# Patient Record
Sex: Male | Born: 1975 | Race: Black or African American | Hispanic: No | Marital: Single | Smoking: Current some day smoker
Health system: Southern US, Community
[De-identification: ages and names within clinical notes are randomized; demographics above are authoritative.]

## PROBLEM LIST (undated history)

## (undated) ENCOUNTER — Ambulatory Visit (HOSPITAL_COMMUNITY): Admission: EM | Discharge status: Absent without leave | Payer: Self-pay

---

## 2009-12-09 ENCOUNTER — Emergency Department (HOSPITAL_COMMUNITY): Admission: EM | Admit: 2009-12-09 | Discharge: 2009-12-09 | Payer: Self-pay | Admitting: Family Medicine

## 2010-07-07 ENCOUNTER — Emergency Department (HOSPITAL_COMMUNITY): Payer: Self-pay

## 2010-07-07 ENCOUNTER — Emergency Department (HOSPITAL_COMMUNITY)
Admission: EM | Admit: 2010-07-07 | Discharge: 2010-07-07 | Disposition: A | Discharge status: Home / Self Care | Payer: Self-pay | Attending: Emergency Medicine | Admitting: Emergency Medicine

## 2010-07-07 DIAGNOSIS — S93609A Unspecified sprain of unspecified foot, initial encounter: Secondary | ICD-10-CM | POA: Insufficient documentation

## 2010-07-07 DIAGNOSIS — Y92410 Unspecified street and highway as the place of occurrence of the external cause: Secondary | ICD-10-CM | POA: Insufficient documentation

## 2010-07-07 DIAGNOSIS — IMO0002 Reserved for concepts with insufficient information to code with codable children: Secondary | ICD-10-CM | POA: Insufficient documentation

## 2010-07-26 ENCOUNTER — Emergency Department (HOSPITAL_COMMUNITY)
Admission: EM | Admit: 2010-07-26 | Discharge: 2010-07-26 | Disposition: A | Discharge status: Home / Self Care | Payer: Self-pay | Attending: Emergency Medicine | Admitting: Emergency Medicine

## 2010-07-26 DIAGNOSIS — L259 Unspecified contact dermatitis, unspecified cause: Secondary | ICD-10-CM | POA: Insufficient documentation

## 2011-08-06 ENCOUNTER — Encounter (HOSPITAL_COMMUNITY): Payer: Self-pay

## 2011-08-06 ENCOUNTER — Emergency Department (HOSPITAL_COMMUNITY)
Admission: EM | Admit: 2011-08-06 | Discharge: 2011-08-06 | Disposition: A | Discharge status: Home / Self Care | Payer: Self-pay | Attending: Emergency Medicine | Admitting: Emergency Medicine

## 2011-08-06 DIAGNOSIS — F17200 Nicotine dependence, unspecified, uncomplicated: Secondary | ICD-10-CM | POA: Insufficient documentation

## 2011-08-06 DIAGNOSIS — M545 Low back pain, unspecified: Secondary | ICD-10-CM | POA: Insufficient documentation

## 2011-08-06 DIAGNOSIS — W19XXXA Unspecified fall, initial encounter: Secondary | ICD-10-CM | POA: Insufficient documentation

## 2011-08-06 DIAGNOSIS — Z79899 Other long term (current) drug therapy: Secondary | ICD-10-CM | POA: Insufficient documentation

## 2011-08-06 DIAGNOSIS — T148XXA Other injury of unspecified body region, initial encounter: Secondary | ICD-10-CM | POA: Insufficient documentation

## 2011-08-06 DIAGNOSIS — M542 Cervicalgia: Secondary | ICD-10-CM | POA: Insufficient documentation

## 2011-08-06 MED ORDER — NAPROXEN 500 MG PO TABS: 500.0000 mg | ORAL_TABLET | Freq: Two times a day (BID) | ORAL | Status: DC | PRN

## 2011-08-06 MED ORDER — DIAZEPAM 5 MG PO TABS: 5.0000 mg | ORAL_TABLET | Freq: Three times a day (TID) | ORAL | Status: AC | PRN

## 2011-08-06 MED ORDER — IBUPROFEN 200 MG PO TABS
400.0000 mg | ORAL_TABLET | Freq: Once | ORAL | Status: AC
  Administered 2011-08-06: 400 mg via ORAL
  Filled 2011-08-06: qty 2

## 2011-08-06 MED ORDER — OXYCODONE-ACETAMINOPHEN 5-325 MG PO TABS: 1.0000 | ORAL_TABLET | ORAL | Status: AC | PRN

## 2011-08-06 NOTE — ED Provider Notes (Signed)
History    Or back pain and neck pain. Acute onset earlier today. Patient was helping to dislodge a tractor which was lodged in the mud. It gave free and he lost his balance and went into a fence. Acute onset lower back pain. Since then had also developed left-sided neck pain. Has been ambulatory since. No numbness, tingling or loss of strength. Denies history of chronic back or neck pain. No blood thinning medication. Denies hx of back or neck surgery.  CSN: [id number]  Arrival date & time 08/06/11  1322   First MD Initiated Contact with Patient 08/06/11 1506      Chief Complaint  Patient presents with  . Back Pain  . Neck Pain    (Consider location/radiation/quality/duration/timing/severity/associated sxs/prior treatment) HPI  History reviewed. No pertinent past medical history.  History reviewed. No pertinent past surgical history.  History reviewed. No pertinent family history.  History  Substance Use Topics  . Smoking status: Current Some Day Smoker  . Smokeless tobacco: Not on file  . Alcohol Use: Yes     occasionally      Review of Systems   Review of symptoms negative unless otherwise noted in HPI.   Allergies  Review of patient's allergies indicates no known allergies.  Home Medications   Current Outpatient Rx  Name Route Sig Dispense Refill  . DIAZEPAM 5 MG PO TABS Oral Take 1 tablet (5 mg total) by mouth every 8 (eight) hours as needed (muscle spasm). 9 tablet 0  . NAPROXEN 500 MG PO TABS Oral Take 1 tablet (500 mg total) by mouth 2 (two) times daily as needed. 30 tablet 0  . OXYCODONE-ACETAMINOPHEN 5-325 MG PO TABS Oral Take 1 tablet by mouth every 4 (four) hours as needed for pain. 8 tablet 0    BP 133/86  Pulse 80  Temp(Src) 98.6 F (37 C) (Oral)  Resp 16  SpO2 96%  Physical Exam  Nursing note and vitals reviewed. Constitutional: He is oriented to person, place, and time. He appears well-developed and well-nourished. No distress.  HENT:    Head: Normocephalic and atraumatic.  Eyes: Conjunctivae are normal. Right eye exhibits no discharge. Left eye exhibits no discharge.  Neck: Neck supple.  Cardiovascular: Normal rate, regular rhythm and normal heart sounds.  Exam reveals no gallop and no friction rub.   No murmur heard. Pulmonary/Chest: Effort normal and breath sounds normal. No respiratory distress.  Abdominal: Soft. He exhibits no distension. There is no tenderness.  Musculoskeletal: He exhibits no edema and no tenderness.       No midline spinal tenderness. Mild tenderness in the left paraspinal lumbar region. Overlying skin is grossly normal in appearance.   Neurological: He is alert and oriented to person, place, and time. No cranial nerve deficit. He exhibits normal muscle tone. Coordination normal.       Strength is 5 out of 5 bilateral lower extremities. Sensation is grossly intact to light touch. Patellar reflexes are 2+ bilaterally.Gait is steady.  Skin: Skin is warm and dry.  Psychiatric: He has a normal mood and affect. His behavior is normal. Thought content normal.    ED Course  Procedures (including critical care time)  Labs Reviewed - No data to display No results found.   1. Pain in lower back   2. Muscle strain       MDM  36 year old male with lower back and neck pain. Suspect muscular strain. No signs of spinal cord impingement. Very low suspicion for fracture. Imaging  was deferred at this time. Plan symptomatic treatment. Return precautions were discussed. Outpatient followup otherwise.        Raeford Razor, MD 08/06/11 [phone]

## 2011-08-06 NOTE — ED Notes (Signed)
Patient reports that his lawn mower was stuck in the mud and when trying to get mower out he was pinned between mower and the fence. Patient is now c/o low back pain and posterior neck pain.

## 2011-08-06 NOTE — Discharge Instructions (Signed)
Back Pain, Adult Low back pain is very common. About 1 in 5 people have back pain.The cause of low back pain is rarely dangerous. The pain often gets better over time.About half of people with a sudden onset of back pain feel better in just 2 weeks. About 8 in 10 people feel better by 6 weeks.  CAUSES Some common causes of back pain include:  Strain of the muscles or ligaments supporting the spine.   Wear and tear (degeneration) of the spinal discs.   Arthritis.   Direct injury to the back.  DIAGNOSIS Most of the time, the direct cause of low back pain is not known.However, back pain can be treated effectively even when the exact cause of the pain is unknown.Answering your caregiver's questions about your overall health and symptoms is one of the most accurate ways to make sure the cause of your pain is not dangerous. If your caregiver needs more information, he or she may order lab work or imaging tests (X-rays or MRIs).However, even if imaging tests show changes in your back, this usually does not require surgery. HOME CARE INSTRUCTIONS For many people, back pain returns.Since low back pain is rarely dangerous, it is often a condition that people can learn to [facility] their own.   Remain active. It is stressful on the back to sit or stand in one place. Do not sit, drive, or stand in one place for more than 30 minutes at a time. Take short walks on level surfaces as soon as pain allows.Try to increase the length of time you walk each day.   Do not stay in bed.Resting more than 1 or 2 days can delay your recovery.   Do not avoid exercise or work.Your body is made to move.It is not dangerous to be active, even though your back may hurt.Your back will likely heal faster if you return to being active before your pain is gone.   Pay attention to your body when you bend and lift. Many people have less discomfortwhen lifting if they bend their knees, keep the load close to their  bodies,and avoid twisting. Often, the most comfortable positions are those that put less stress on your recovering back.   Find a comfortable position to sleep. Use a firm mattress and lie on your side with your knees slightly bent. If you lie on your back, put a pillow under your knees.   Only take over-the-counter or prescription medicines as directed by your caregiver. Over-the-counter medicines to reduce pain and inflammation are often the most helpful.Your caregiver may prescribe muscle relaxant drugs.These medicines help dull your pain so you can more quickly return to your normal activities and healthy exercise.   Put ice on the injured area.   Put ice in a plastic bag.   Place a towel between your skin and the bag.   Leave the ice on for 15 to 20 minutes, 3 to 4 times a day for the first 2 to 3 days. After that, ice and heat may be alternated to reduce pain and spasms.   Ask your caregiver about trying back exercises and gentle massage. This may be of some benefit.   Avoid feeling anxious or stressed.Stress increases muscle tension and can worsen back pain.It is important to recognize when you are anxious or stressed and learn ways to manage it.Exercise is a great option.  SEEK MEDICAL CARE IF:  You have pain that is not relieved with rest or medicine.   You have  pain that does not improve in 1 week.   You have new symptoms.   You are generally not feeling well.  SEEK IMMEDIATE MEDICAL CARE IF:   You have pain that radiates from your back into your legs.   You develop new bowel or bladder control problems.   You have unusual weakness or numbness in your arms or legs.   You develop nausea or vomiting.   You develop abdominal pain.   You feel faint.  Document Released: 03/22/2005 Document Revised: 03/11/2011 Document Reviewed: 08/10/2010 [facility] Patient Information 2012 [employer], [place].Muscle Strain A muscle strain (pulled muscle) happens when a muscle is  over-stretched. Recovery usually takes 5 to 6 weeks.  HOME CARE   Put ice on the injured area.   Put ice in a plastic bag.   Place a towel between your skin and the bag.   Leave the ice on for 15 to 20 minutes at a time, every hour for the first 2 days.   Do not use the muscle for several days or until your doctor says you can. Do not use the muscle if you have pain.   Wrap the injured area with an elastic bandage for comfort. Do not put it on too tightly.   Only take medicine as told by your doctor.   Warm up before exercise. This helps prevent muscle strains.  GET HELP RIGHT AWAY IF:  There is increased pain or puffiness (swelling) in the affected area. MAKE SURE YOU:   Understand these instructions.   Will watch your condition.   Will get help right away if you are not doing well or get worse.  Document Released: 12/30/2007 Document Revised: 03/11/2011 Document Reviewed: 12/30/2007 [facility] Patient Information 2012 [place], [place].  RESOURCE GUIDE  Dental Problems  Patients with Medicaid: [facility] [phone]                                           [phone] [address] Phone:  [phone]                                                  Phone:  [phone]  If unable to pay or uninsured, contact:  [facility] or [facility]. to become qualified for the adult dental clinic.  Chronic Pain Problems Contact [facility]  [phone] Patients need to be referred by their primary care doctor.  Insufficient Money for Medicine Contact [employer]:  call "211" or [facility] 562-435-0469.  No Primary Care Doctor Call [facility]  [phone] Other agencies that provide inexpensive medical care    [facility]  [phone]    [facility]  [phone]    [facility]  [phone]    [facility]  [phone]    [facility]  747-237-7983     [facility]  [phone]  Psychological Services [facility]  [phone] [facility]  [phone] [facility]   [phone] (emergency services [phone])  Substance Abuse Resources [facility]  984-828-4725  [facility] [phone] [facility] 8194676075 Floydene Flock [phone] Residential & Outpatient Substance Abuse Program  713-687-5813  Abuse/Neglect [facility] Child Abuse Hotline 502-712-5083 [facility] Child Abuse Hotline (605) 198-0646 (After Hours)  Emergency Shelter [facility] 352-849-9090  Maternity Homes Room at the [place] [facility] [place] [phone] [place] Alert Services 8652283462  MRSA Hotline #:   (716) 473-2590    [facility] Resources  [facility]     [employer]                          [facility] Dept. [address]                        [address]      [address]  Blondell Reveal Phone:  [phone]                                   Phone:  [phone]                 Phone:  [phone]  [facility] Phone:  [phone]  [facility] Child Abuse Hotline 3010405945 [phone] (After Hours)

## 2012-01-03 ENCOUNTER — Emergency Department (HOSPITAL_COMMUNITY)
Admission: EM | Admit: 2012-01-03 | Discharge: 2012-01-03 | Disposition: A | Discharge status: Home / Self Care | Payer: Self-pay | Attending: Emergency Medicine | Admitting: Emergency Medicine

## 2012-01-03 ENCOUNTER — Encounter (HOSPITAL_COMMUNITY): Payer: Self-pay | Admitting: Emergency Medicine

## 2012-01-03 DIAGNOSIS — Z1889 Other specified retained foreign body fragments: Secondary | ICD-10-CM | POA: Insufficient documentation

## 2012-01-03 DIAGNOSIS — F17200 Nicotine dependence, unspecified, uncomplicated: Secondary | ICD-10-CM | POA: Insufficient documentation

## 2012-01-03 DIAGNOSIS — S60559A Superficial foreign body of unspecified hand, initial encounter: Secondary | ICD-10-CM

## 2012-01-03 DIAGNOSIS — X58XXXA Exposure to other specified factors, initial encounter: Secondary | ICD-10-CM | POA: Insufficient documentation

## 2012-01-03 DIAGNOSIS — Z23 Encounter for immunization: Secondary | ICD-10-CM | POA: Insufficient documentation

## 2012-01-03 DIAGNOSIS — S61409A Unspecified open wound of unspecified hand, initial encounter: Secondary | ICD-10-CM | POA: Insufficient documentation

## 2012-01-03 MED ORDER — CEPHALEXIN 500 MG PO CAPS: 500.0000 mg | ORAL_CAPSULE | Freq: Two times a day (BID) | ORAL | Status: DC

## 2012-01-03 MED ORDER — TETANUS-DIPHTH-ACELL PERTUSSIS 5-2.5-18.5 LF-MCG/0.5 IM SUSP
0.5000 mL | Freq: Once | INTRAMUSCULAR | Status: AC
  Administered 2012-01-03: 0.5 mL via INTRAMUSCULAR
  Filled 2012-01-03: qty 0.5

## 2012-01-03 NOTE — ED Provider Notes (Signed)
Medical screening examination/treatment/procedure(s) were performed by non-physician practitioner and as supervising physician I was immediately available for consultation/collaboration.  Doug Sou, MD 01/03/12 1704

## 2012-01-03 NOTE — ED Notes (Signed)
Pt was cleaning a car at work and reached into the car and was stuck by a fishing hook.  Pt with barbed hook in palm of left hand.  Pt does not know when last Tetanus shot was.

## 2012-01-03 NOTE — ED Provider Notes (Signed)
History     CSN: [id number]  Arrival date & time 01/03/12  1345   First MD Initiated Contact with Patient 01/03/12 1350      Chief Complaint  Patient presents with  . 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  Pt is unsure of when his last tetanus was.  Denies weakness or numbness in his fingers.  Denies other injury.  This injury occurred just prior to arrival.    The history is provided by the patient.    History reviewed. No pertinent past medical history.  History reviewed. No pertinent past surgical history.  History reviewed. No pertinent family history.  History  Substance Use Topics  . Smoking status: Current Some Day Smoker  . Smokeless tobacco: Not on file  . Alcohol Use: Yes     occasionally      Review of Systems  Skin: Positive for wound.  Neurological: Negative for weakness and numbness.    Allergies  Review of patient's allergies indicates no known allergies.  Home Medications  No current outpatient prescriptions on file.  BP 138/90  Pulse 78  Temp 98.5 F (36.9 C) (Oral)  Resp 18  SpO2 100%  Physical Exam  Nursing note and vitals reviewed. Constitutional: He appears well-developed and well-nourished. No distress.  HENT:  Head: Normocephalic and atraumatic.  Neck: Neck supple.  Pulmonary/Chest: Effort normal.  Musculoskeletal:       Left hand: He exhibits tenderness. He exhibits normal capillary refill and no deformity. normal sensation noted.       Hands: Neurological: He is alert.  Skin: He is not diaphoretic.    ED Course  FOREIGN BODY REMOVAL Date/Time: 01/03/2012 2:34 PM Performed by: Trixie Dredge Authorized by: Trixie Dredge Consent: Verbal consent obtained. Consent given by:  patient Patient understanding: patient states understanding of the procedure being performed Patient identity confirmed: verbally with patient Body area: skin General location: upper extremity Location details: left hand Anesthesia: local infiltration Local anesthetic: lidocaine 1% without epinephrine Anesthetic total: 4 ml Patient sedated: no Patient restrained: no Patient cooperative: yes Localization method: visualized Removal mechanism: scalpel (and wire cutters) Tendon involvement: none Complexity: simple 1 objects recovered. Objects recovered: fish hook Post-procedure assessment: foreign body removed Patient tolerance: Patient tolerated the procedure well with no immediate complications.   (including critical care time)  Labs Reviewed - No data to display No results found.  Nurse advised that small piece of fish hook that was clipped with wire cutters flew into the air and I was not able to locate it.  Nurse to call custodian to perform thorough cleaning of room and retrieval of fish hook end prior to new patient being placed in the room.    1. Foreign body in hand     MDM  Pt with fish hook accidentally stuck in hand while at work.  Neurovascularly intact. Hook removed in ED.  Tetanus updated.  Discussed wound care and return precautions.  Pt placed on keflex as prophylaxis to prevent infection.  OTC pain medications for pain.  Pt verbalizes understanding and agrees with plan.           [place], [place] 01/03/12 [phone]

## 2014-01-15 ENCOUNTER — Encounter (HOSPITAL_COMMUNITY): Payer: Self-pay | Admitting: Emergency Medicine

## 2014-01-15 ENCOUNTER — Emergency Department (HOSPITAL_COMMUNITY)
Admission: EM | Admit: 2014-01-15 | Discharge: 2014-01-15 | Discharge status: Against Medical Advice | Payer: BC Managed Care – PPO | Attending: Emergency Medicine | Admitting: Emergency Medicine

## 2014-01-15 DIAGNOSIS — S50862A Insect bite (nonvenomous) of left forearm, initial encounter: Secondary | ICD-10-CM | POA: Insufficient documentation

## 2014-01-15 DIAGNOSIS — Y9389 Activity, other specified: Secondary | ICD-10-CM | POA: Diagnosis not present

## 2014-01-15 DIAGNOSIS — W57XXXA Bitten or stung by nonvenomous insect and other nonvenomous arthropods, initial encounter: Secondary | ICD-10-CM | POA: Diagnosis not present

## 2014-01-15 DIAGNOSIS — Y9289 Other specified places as the place of occurrence of the external cause: Secondary | ICD-10-CM | POA: Insufficient documentation

## 2014-01-15 DIAGNOSIS — Z720 Tobacco use: Secondary | ICD-10-CM | POA: Diagnosis not present

## 2014-01-15 NOTE — ED Notes (Signed)
PT states that he was bite by a spider to left forearm 3 days ago; pt states that the bite is itching but otherwise OK; pt states that ever since then he has been having red raised bumps come up that itch as well; pt states that he had a bite several years ago that required anbx and he wants to ward that off before it happens

## 2014-04-03 ENCOUNTER — Ambulatory Visit (INDEPENDENT_AMBULATORY_CARE_PROVIDER_SITE_OTHER): Payer: BC Managed Care – PPO | Admitting: Family

## 2014-04-03 ENCOUNTER — Other Ambulatory Visit (INDEPENDENT_AMBULATORY_CARE_PROVIDER_SITE_OTHER): Payer: BC Managed Care – PPO

## 2014-04-03 ENCOUNTER — Encounter: Payer: Self-pay | Admitting: Family

## 2014-04-03 VITALS — BP 130/84 | HR 63 | Temp 98.1°F | Resp 18 | Ht 69.0 in | Wt 161.8 lb

## 2014-04-03 DIAGNOSIS — Z0000 Encounter for general adult medical examination without abnormal findings: Secondary | ICD-10-CM | POA: Insufficient documentation

## 2014-04-03 LAB — CBC
HCT: 41.7 % (ref 39.0–52.0)
HEMOGLOBIN: 13.9 g/dL (ref 13.0–17.0)
MCHC: 33.4 g/dL (ref 30.0–36.0)
MCV: 92.3 fl (ref 78.0–100.0)
PLATELETS: 256 10*3/uL (ref 150.0–400.0)
RBC: 4.51 Mil/uL (ref 4.22–5.81)
RDW: 13.6 % (ref 11.5–15.5)
WBC: 8.9 10*3/uL (ref 4.0–10.5)

## 2014-04-03 LAB — BASIC METABOLIC PANEL
BUN: 16 mg/dL (ref 6–23)
CALCIUM: 9.4 mg/dL (ref 8.4–10.5)
CO2: 28 mEq/L (ref 19–32)
CREATININE: 1 mg/dL (ref 0.4–1.5)
Chloride: 104 mEq/L (ref 96–112)
GFR: 108.48 mL/min (ref >60.00)
GLUCOSE: 83 mg/dL (ref 70–99)
POTASSIUM: 4.6 meq/L (ref 3.5–5.1)
SODIUM: 139 meq/L (ref 135–145)

## 2014-04-03 LAB — TSH: TSH: 1.23 u[IU]/mL (ref 0.35–4.50)

## 2014-04-03 NOTE — Assessment & Plan Note (Signed)
1) Anticipatory Guidance: Discussed importance of wearing a seatbelt while driving and not texting while driving; changing batteries in smoke detector at least once annually; wearing suntan lotion when outside; eating a balanced and moderate diet; getting physical activity at least 30 minutes per day.  2) Immunizations / Screenings / Labs:  Patient declined flu shot today. All other immunizations are up-to-date per recommendations. Patient is due for a vision exam and he will schedule. All other screenings are up-to-date per recommendations. Obtain CBC, BMET, Lipid profile and TSH.   Overall well exam. Continue current healthy lifestyle choices. Goal will be to complete his vision exam and work on eating breakfast. Follow up prevention exam in one year, pending lab results.

## 2014-04-03 NOTE — Patient Instructions (Signed)
Thank you for choosing [facility].  Summary/Instructions:  Health Maintenance A healthy lifestyle and preventative care can promote health and wellness.  Maintain regular health, dental, and eye exams.  Eat a healthy diet. Foods like vegetables, fruits, whole grains, low-fat dairy products, and lean protein foods contain the nutrients you need and are low in calories. Decrease your intake of foods high in solid fats, added sugars, and salt. Get information about a proper diet from your health care provider, if necessary.  Regular physical exercise is one of the most important things you can do for your health. Most adults should get at least 150 minutes of moderate-intensity exercise (any activity that increases your heart rate and causes you to sweat) each week. In addition, most adults need muscle-strengthening exercises on 2 or more days a week.   Maintain a healthy weight. The body mass index (BMI) is a screening tool to identify possible weight problems. It provides an estimate of body fat based on height and weight. Your health care provider can find your BMI and can help you achieve or maintain a healthy weight. For males 20 years and older:  A BMI below 18.5 is considered underweight.  A BMI of 18.5 to 24.9 is normal.  A BMI of 25 to 29.9 is considered overweight.  A BMI of 30 and above is considered obese.  Maintain normal blood lipids and cholesterol by exercising and minimizing your intake of saturated fat. Eat a balanced diet with plenty of fruits and vegetables. Blood tests for lipids and cholesterol should begin at age 20 and be repeated every 5 years. If your lipid or cholesterol levels are high, you are over age 50, or you are at high risk for heart disease, you may need your cholesterol levels checked more frequently.Ongoing high lipid and cholesterol levels should be treated with medicines if diet and exercise are not working.  If you smoke, find out from your  health care provider how to quit. If you do not use tobacco, do not start.  Lung cancer screening is recommended for adults aged 55-80 years who are at high risk for developing lung cancer because of a history of smoking. A yearly low-dose CT scan of the lungs is recommended for people who have at least a 30-pack-year history of smoking and are current smokers or have quit within the past 15 years. A pack year of smoking is smoking an average of 1 pack of cigarettes a day for 1 year (for example, a 30-pack-year history of smoking could mean smoking 1 pack a day for 30 years or 2 packs a day for 15 years). Yearly screening should continue until the smoker has stopped smoking for at least 15 years. Yearly screening should be stopped for people who develop a health problem that would prevent them from having lung cancer treatment.  If you choose to drink alcohol, do not have more than 2 drinks per day. One drink is considered to be 12 oz (360 mL) of beer, 5 oz (150 mL) of wine, or 1.5 oz (45 mL) of liquor.  Avoid the use of street drugs. Do not share needles with anyone. Ask for help if you need support or instructions about stopping the use of drugs.  High blood pressure causes heart disease and increases the risk of stroke. Blood pressure should be checked at least every 1-2 years. Ongoing high blood pressure should be treated with medicines if weight loss and exercise are not effective.  If you are   45-79 years old, ask your health care provider if you should take aspirin to prevent heart disease.  Diabetes screening involves taking a blood sample to check your fasting blood sugar level. This should be done once every 3 years after age 45 if you are at a normal weight and without risk factors for diabetes. Testing should be considered at a younger age or be carried out more frequently if you are overweight and have at least 1 risk factor for diabetes.  Colorectal cancer can be detected and often  prevented. Most routine colorectal cancer screening begins at the age of 50 and continues through age 75. However, your health care provider may recommend screening at an earlier age if you have risk factors for colon cancer. On a yearly basis, your health care provider may provide home test kits to check for hidden blood in the stool. A small camera at the end of a tube may be used to directly examine the colon (sigmoidoscopy or colonoscopy) to detect the earliest forms of colorectal cancer. Talk to your health care provider about this at age 50 when routine screening begins. A direct exam of the colon should be repeated every 5-10 years through age 75, unless early forms of precancerous polyps or small growths are found.  People who are at an increased risk for hepatitis B should be screened for this virus. You are considered at high risk for hepatitis B if:  You were born in a country where hepatitis B occurs often. Talk with your health care provider about which countries are considered high risk.  Your parents were born in a high-risk country and you have not received a shot to protect against hepatitis B (hepatitis B vaccine).  You have HIV or AIDS.  You use needles to inject street drugs.  You live with, or have sex with, someone who has hepatitis B.  You are a man who has sex with other men (MSM).  You get hemodialysis treatment.  You take certain medicines for conditions like cancer, organ transplantation, and autoimmune conditions.  Hepatitis C blood testing is recommended for all people born from 1945 through 1965 and any individual with known risk factors for hepatitis C.  Healthy men should no longer receive prostate-specific antigen (PSA) blood tests as part of routine cancer screening. Talk to your health care provider about prostate cancer screening.  Testicular cancer screening is not recommended for adolescents or adult males who have no symptoms. Screening includes  self-exam, a health care provider exam, and other screening tests. Consult with your health care provider about any symptoms you have or any concerns you have about testicular cancer.  Practice safe sex. Use condoms and avoid high-risk sexual practices to reduce the spread of sexually transmitted infections (STIs).  You should be screened for STIs, including gonorrhea and chlamydia if:  You are sexually active and are younger than 24 years.  You are older than 24 years, and your health care provider tells you that you are at risk for this type of infection.  Your sexual activity has changed since you were last screened, and you are at an increased risk for chlamydia or gonorrhea. Ask your health care provider if you are at risk.  If you are at risk of being infected with HIV, it is recommended that you take a prescription medicine daily to prevent HIV infection. This is called pre-exposure prophylaxis (PrEP). You are considered at risk if:  You are a man who has sex with other   men (MSM).  You are a heterosexual man who is sexually active with multiple partners.  You take drugs by injection.  You are sexually active with a partner who has HIV.  Talk with your health care provider about whether you are at high risk of being infected with HIV. If you choose to begin PrEP, you should first be tested for HIV. You should then be tested every 3 months for as long as you are taking PrEP.  Use sunscreen. Apply sunscreen liberally and repeatedly throughout the day. You should seek shade when your shadow is shorter than you. Protect yourself by wearing long sleeves, pants, a wide-brimmed hat, and sunglasses year round whenever you are outdoors.  Tell your health care provider of new moles or changes in moles, especially if there is a change in shape or color. Also, tell your health care provider if a mole is larger than the size of a pencil eraser.  A one-time screening for abdominal aortic aneurysm  (AAA) and surgical repair of large AAAs by ultrasound is recommended for men aged 65-75 years who are current or former smokers.  Stay current with your vaccines (immunizations). Document Released: 09/18/2007 Document Revised: 03/27/2013 Document Reviewed: 08/17/2010 ExitCare Patient Information 2015 [employer]. This information is not intended to replace advice given to you by your health care provider. Make sure you discuss any questions you have with your health care provider.    

## 2014-04-03 NOTE — Progress Notes (Signed)
Subjective:    Patient ID: Darryl Freeman, male    DOB: November 04, 1975, 38 y.o.   MRN: [id number]  Chief Complaint  Patient presents with  . CPE    not fasting, would like to make sure prostate is good no cancer    HPI:  Darryl Freeman is a 38 y.o. male who presents today for an annual wellness visit.   1) Health Maintenance - Indicates that he feels pretty good.   Diet - eat a variety and doesn't eat breakfast a lot.  Exercise - not currently on a regimen.   2) Preventative Exams / Immunizations:  Dental -- Up to date [facility] -- Due to exam  There are no preventive care reminders to display for this patient.  Immunization History  Administered Date(s) Administered  . Tdap 01/03/2012  Declines flu shot.  No Known Allergies  No current outpatient prescriptions on file prior to visit.   No current facility-administered medications on file prior to visit.    History reviewed. No pertinent past medical history.  History reviewed. No pertinent past surgical history.   Family History  Problem Relation Age of Onset  . Diabetes Mother   . Lung cancer Father     History   Social History  . Marital Status: Single    Spouse Name: N/A    Number of Children: 0  . Years of Education: 12   Occupational History  . Not on file.   Social History Main Topics  . Smoking status: Current Some Day Smoker -- 0.50 packs/day for 4 years    Types: Cigarettes  . Smokeless tobacco: Never Used  . Alcohol Use: Yes     Comment: occasionally  . Drug Use: No  . Sexual Activity: Not on file   Other Topics Concern  . Not on file   Social History Narrative   Born [place] raised [place], [place] Currently resides in a house with his girlfriend. 2 dogs. Fun: Play cards, gamble   Denies religious beliefs effecting health care.      Review of Systems  Constitutional: Denies fever, chills, fatigue, or significant weight gain/loss. HENT: Head: Denies headache or neck pain Ears:  Denies changes in hearing, ringing in ears, earache, drainage Nose: Denies discharge, stuffiness, itching, nosebleed, sinus pain Throat: Denies sore throat, hoarseness, dry mouth, sores, thrush Eyes: Denies loss/changes in vision, pain, redness, blurry/double vision, flashing lights Cardiovascular: Denies chest pain/discomfort, tightness, palpitations, shortness of breath with activity, difficulty lying down, swelling, sudden awakening with shortness of breath Respiratory: Denies shortness of breath, cough, sputum production, wheezing Gastrointestinal: Denies dysphasia, heartburn, change in appetite, nausea, change in bowel habits, rectal bleeding, constipation, diarrhea, yellow skin or eyes Genitourinary: Denies frequency, urgency, burning/pain, blood in urine, incontinence, change in urinary strength. Musculoskeletal: Denies muscle/joint pain, stiffness, back pain, redness or swelling of joints, trauma 2 calluses on the bottom of both feet Skin: Denies rashes, lumps, itching, dryness, color changes, or hair/nail changes Neurological: Denies dizziness, fainting, seizures, weakness, numbness, tingling, tremor Psychiatric - Denies nervousness, stress, depression or memory loss Endocrine: Denies heat or cold intolerance, sweating, frequent urination, excessive thirst, changes in appetite Hematologic: Denies ease of bruising or bleeding    Objective:    BP 130/84 mmHg  Pulse 63  Temp(Src) 98.1 F (36.7 C) (Oral)  Resp 18  Ht 5\' 9"  (1.753 m)  Wt 161 lb 12.8 oz (73.392 kg)  BMI 23.88 kg/m2  SpO2 97% Nursing note and vital signs reviewed.  Physical  Exam  Constitutional: He is oriented to person, place, and time. He appears well-developed and well-nourished.  HENT:  Head: Normocephalic.  Right Ear: Hearing, tympanic membrane, external ear and ear canal normal.  Left Ear: Hearing, tympanic membrane, external ear and ear canal normal.  Nose: Nose normal.  Mouth/Throat: Uvula is midline,  oropharynx is clear and moist and mucous membranes are normal.  Eyes: Conjunctivae and EOM are normal. Pupils are equal, round, and reactive to light.  Neck: Neck supple. No JVD present. No tracheal deviation present. No thyromegaly present.  Cardiovascular: Normal rate, regular rhythm, normal heart sounds and intact distal pulses.   Pulmonary/Chest: Effort normal and breath sounds normal.  Abdominal: Soft. Bowel sounds are normal. He exhibits no distension and no mass. There is no tenderness. There is no rebound and no guarding.  Musculoskeletal: Normal range of motion. He exhibits no edema or tenderness.  Lymphadenopathy:    He has no cervical adenopathy.  Neurological: He is alert and oriented to person, place, and time. He has normal reflexes. No cranial nerve deficit. He exhibits normal muscle tone. Coordination normal.  Skin: Skin is warm and dry.  Psychiatric: He has a normal mood and affect. His behavior is normal. Judgment and thought content normal.       Assessment & Plan:

## 2014-04-03 NOTE — Progress Notes (Signed)
Pre visit review using our clinic review tool, if applicable. No additional management support is needed unless otherwise documented below in the visit note. 

## 2014-04-04 ENCOUNTER — Telehealth: Payer: Self-pay | Admitting: Family

## 2014-04-04 LAB — HIV ANTIBODY (ROUTINE TESTING W REFLEX): HIV 1&2 Ab, 4th Generation: NONREACTIVE

## 2014-04-04 NOTE — Telephone Encounter (Signed)
Called pt to let him know that labs were normal. Labs are being mailed to pt.

## 2014-04-04 NOTE — Telephone Encounter (Signed)
Please call and inform the patient that his lab work was normal were all within the normal limits and his HIV screen was negative. Therefore he can follow up as needed or for another physical in 1 year.

## 2016-07-29 ENCOUNTER — Ambulatory Visit (INDEPENDENT_AMBULATORY_CARE_PROVIDER_SITE_OTHER): Payer: BLUE CROSS/BLUE SHIELD | Admitting: Emergency Medicine

## 2016-07-29 VITALS — BP 125/83 | HR 61 | Temp 98.6°F | Resp 16 | Ht 69.0 in | Wt 154.2 lb

## 2016-07-29 DIAGNOSIS — S39012A Strain of muscle, fascia and tendon of lower back, initial encounter: Secondary | ICD-10-CM

## 2016-07-29 DIAGNOSIS — K644 Residual hemorrhoidal skin tags: Secondary | ICD-10-CM | POA: Diagnosis not present

## 2016-07-29 MED ORDER — HYDROCORTISONE 2.5 % RE CREA: 1.0000 "application " | TOPICAL_CREAM | Freq: Two times a day (BID) | RECTAL | 0 refills | Status: DC

## 2016-07-29 MED ORDER — CYCLOBENZAPRINE HCL 10 MG PO TABS: 10.0000 mg | ORAL_TABLET | Freq: Three times a day (TID) | ORAL | 0 refills | Status: DC | PRN

## 2016-07-29 MED ORDER — DICLOFENAC SODIUM 75 MG PO TBEC: 75.0000 mg | DELAYED_RELEASE_TABLET | Freq: Two times a day (BID) | ORAL | 0 refills | Status: AC

## 2016-07-29 NOTE — Patient Instructions (Addendum)
IF you received an x-ray today, you will receive an invoice from [facility]. Please contact [facility] at [phone] with questions or concerns regarding your invoice.   IF you received labwork today, you will receive an invoice from [facility]. Please contact [facility] at [phone] with questions or concerns regarding your invoice.   Our billing staff will not be able to assist you with questions regarding bills from these companies.  You will be contacted with the lab results as soon as they are available. The fastest way to get your results is to activate your My Chart account. Instructions are located on the last page of this paperwork. If you have not heard from [place] regarding the results in 2 weeks, please contact this office.      Hemorrhoids Hemorrhoids are swollen veins in and around the rectum or anus. Hemorrhoids can cause pain, itching, or bleeding. Most of the time, they do not cause serious problems. They usually get better with diet changes, lifestyle changes, and other home treatments. Follow these instructions at home: Eating and drinking   Eat foods that have fiber, such as whole grains, beans, nuts, fruits, and vegetables. Ask your doctor about taking products that have added fiber (fibersupplements).  Drink enough fluid to keep your pee (urine) clear or pale yellow. For Pain and Swelling   Take a warm-water bath (sitz bath) for 20 minutes to ease pain. Do this 3-4 times a day.  If directed, put ice on the painful area. It may be helpful to use ice between your warm baths.  Put ice in a plastic bag.  Place a towel between your skin and the bag.  Leave the ice on for 20 minutes, 2-3 times a day. General instructions   Take over-the-counter and prescription medicines only as told by your doctor.  Medicated creams and medicines that are inserted into the anus (suppositories) may be used or applied as told.  Exercise often.  Go to the  bathroom when you have the urge to poop (to have a bowel movement). Do not wait.  Avoid pushing too hard (straining) when you poop.  Keep the butt area dry and clean. Use wet toilet paper or moist paper towels.  Do not sit on the toilet for a long time. Contact a doctor if:  You have any of these:  Pain and swelling that do not get better with treatment or medicine.  Bleeding that will not stop.  Trouble pooping or you cannot poop.  Pain or swelling outside the area of the hemorrhoids. This information is not intended to replace advice given to you by your health care provider. Make sure you discuss any questions you have with your health care provider. Document Released: 12/30/2007 Document Revised: 08/28/2015 Document Reviewed: 12/04/2014 Elsevier Interactive Patient Education  2017 [employer]  Back Pain, Adult Back pain is very common. The pain often gets better over time. The cause of back pain is usually not dangerous. Most people can learn to manage their back pain on their own. Follow these instructions at home: Watch your back pain for any changes. The following actions may help to lessen any pain you are feeling:  Stay active. Start with short walks on flat ground if you can. Try to walk farther each day.  Exercise regularly as told by your doctor. Exercise helps your back heal faster. It also helps avoid future injury by keeping your muscles strong and flexible.  Do not sit, drive, or stand in  one place for more than 30 minutes.  Do not stay in bed. Resting more than 1-2 days can slow down your recovery.  Be careful when you bend or lift an object. Use good form when lifting:  Bend at your knees.  Keep the object close to your body.  Do not twist.  Sleep on a firm mattress. Lie on your side, and bend your knees. If you lie on your back, put a pillow under your knees.  Take medicines only as told by your doctor.  Put ice on the injured area.  Put ice in a  plastic bag.  Place a towel between your skin and the bag.  Leave the ice on for 20 minutes, 2-3 times a day for the first 2-3 days. After that, you can switch between ice and heat packs.  Avoid feeling anxious or stressed. Find good ways to deal with stress, such as exercise.  Maintain a healthy weight. Extra weight puts stress on your back. Contact a doctor if:  You have pain that does not go away with rest or medicine.  You have worsening pain that goes down into your legs or buttocks.  You have pain that does not get better in one week.  You have pain at night.  You lose weight.  You have a fever or chills. Get help right away if:  You cannot control when you poop (bowel movement) or pee (urinate).  Your arms or legs feel weak.  Your arms or legs lose feeling (numbness).  You feel sick to your stomach (nauseous) or throw up (vomit).  You have belly (abdominal) pain.  You feel like you may pass out (faint). This information is not intended to replace advice given to you by your health care provider. Make sure you discuss any questions you have with your health care provider. Document Released: 09/08/2007 Document Revised: 08/28/2015 Document Reviewed: 07/24/2013 Elsevier Interactive Patient Education  2017 [employer].

## 2016-07-29 NOTE — Progress Notes (Signed)
Darryl Freeman 40 y.o.   Chief Complaint  Patient presents with  . Back Pain    lower back pain started two days ago , pain is a 6/10    HISTORY OF PRESENT ILLNESS: This is a 41 y.o. male complaining of low back pain x 2-3 days; also c/o painful non-bleeding hemorrhoids.  Back Pain  This is a new problem. The current episode started in the past 7 days. The problem occurs constantly. The problem has been waxing and waning since onset. The pain is present in the lumbar spine. The quality of the pain is described as aching. The pain does not radiate. The pain is at a severity of 6/10. The pain is moderate. The symptoms are aggravated by bending and position. Pertinent negatives include no abdominal pain, bladder incontinence, bowel incontinence, chest pain, dysuria, fever, headaches, leg pain, numbness, paresis, paresthesias, perianal numbness, tingling, weakness or weight loss. Risk factors: none.     Prior to Admission medications   Not on File    No Known Allergies  Patient Active Problem List   Diagnosis Date Noted  . Routine general medical examination at a health care facility 04/03/2014    History reviewed. No pertinent past medical history.  History reviewed. No pertinent surgical history.  Social History   Social History  . Marital status: Single    Spouse name: N/A  . Number of children: 0  . Years of education: 12   Occupational History  . Not on file.   Social History Main Topics  . Smoking status: Current Some Day Smoker    Packs/day: 0.50    Years: 4.00    Types: Cigarettes  . Smokeless tobacco: Never Used  . Alcohol use Yes     Comment: occasionally  . Drug use: No  . Sexual activity: Not on file   Other Topics Concern  . Not on file   Social History Narrative   Born and raised in [place], [place]. Currently resides in a house with his girlfriend. 2 dogs. Fun: Play cards, gamble   Denies religious beliefs effecting health care.     Family  History  Problem Relation Age of Onset  . Diabetes Mother   . Lung cancer Father      Review of Systems  Constitutional: Negative.  Negative for fever and weight loss.  HENT: Negative.  Negative for nosebleeds, sinus pain and sore throat.   Eyes: Negative.  Negative for blurred vision and double vision.  Respiratory: Negative.  Negative for cough and shortness of breath.   Cardiovascular: Negative.  Negative for chest pain, palpitations and leg swelling.  Gastrointestinal: Negative.  Negative for abdominal pain, blood in stool, bowel incontinence, constipation, diarrhea, melena, nausea and vomiting.  Genitourinary: Negative.  Negative for bladder incontinence, dysuria, flank pain and hematuria.  Musculoskeletal: Positive for back pain. Negative for joint pain and myalgias.  Skin: Negative.  Negative for rash.  Neurological: Negative.  Negative for dizziness, tingling, weakness, numbness, headaches and paresthesias.  Endo/Heme/Allergies: Negative.   All other systems reviewed and are negative.  Vitals:   07/29/16 1406  BP: 125/83  Pulse: 61  Resp: 16  Temp: 98.6 F (37 C)     Physical Exam  Constitutional: He is oriented to person, place, and time. He appears well-developed and well-nourished.  HENT:  Head: Normocephalic and atraumatic.  Nose: Nose normal.  Mouth/Throat: Oropharynx is clear and moist. No oropharyngeal exudate.  Eyes: Conjunctivae and EOM are normal. Pupils are equal, round,  and reactive to light.  Neck: Normal range of motion. Neck supple. No JVD present. No thyromegaly present.  Cardiovascular: Normal rate, regular rhythm, normal heart sounds and intact distal pulses.   Pulmonary/Chest: Effort normal and breath sounds normal.  Abdominal: Soft. Bowel sounds are normal. He exhibits no distension. There is no tenderness.  Genitourinary: Rectal exam shows external hemorrhoid (swollen, no thrombosis, or bleeding).  Musculoskeletal:       Lumbar back: He  exhibits decreased range of motion, tenderness, pain and spasm. He exhibits no bony tenderness, no swelling and normal pulse.  Lymphadenopathy:    He has no cervical adenopathy.  Neurological: He is alert and oriented to person, place, and time. No sensory deficit. He exhibits normal muscle tone.  Skin: Skin is warm and dry. Capillary refill takes less than 2 seconds. No rash noted.  Psychiatric: He has a normal mood and affect. His behavior is normal.  Vitals reviewed.    ASSESSMENT & PLAN: Darryl Freeman was seen today for back pain.  Diagnoses and all orders for this visit:  Strain of lumbar region, initial encounter  Hemorrhoids, external  Other orders -     cyclobenzaprine (FLEXERIL) 10 MG tablet; Take 1 tablet (10 mg total) by mouth 3 (three) times daily as needed for muscle spasms. -     diclofenac (VOLTAREN) 75 MG EC tablet; Take 1 tablet (75 mg total) by mouth 2 (two) times daily. -     hydrocortisone (ANUSOL-HC) 2.5 % rectal cream; Place 1 application rectally 2 (two) times daily.    Patient Instructions       IF you received an x-ray today, you will receive an invoice from [facility]. Please contact [facility] at [phone] with questions or concerns regarding your invoice.   IF you received labwork today, you will receive an invoice from [facility]. Please contact [facility] at [phone] with questions or concerns regarding your invoice.   Our billing staff will not be able to assist you with questions regarding bills from these companies.  You will be contacted with the lab results as soon as they are available. The fastest way to get your results is to activate your My Chart account. Instructions are located on the last page of this paperwork. If you have not heard from [place] regarding the results in 2 weeks, please contact this office.      Hemorrhoids Hemorrhoids are swollen veins in and around the rectum or anus. Hemorrhoids can cause pain,  itching, or bleeding. Most of the time, they do not cause serious problems. They usually get better with diet changes, lifestyle changes, and other home treatments. Follow these instructions at home: Eating and drinking   Eat foods that have fiber, such as whole grains, beans, nuts, fruits, and vegetables. Ask your doctor about taking products that have added fiber (fibersupplements).  Drink enough fluid to keep your pee (urine) clear or pale yellow. For Pain and Swelling   Take a warm-water bath (sitz bath) for 20 minutes to ease pain. Do this 3-4 times a day.  If directed, put ice on the painful area. It may be helpful to use ice between your warm baths.  Put ice in a plastic bag.  Place a towel between your skin and the bag.  Leave the ice on for 20 minutes, 2-3 times a day. General instructions   Take over-the-counter and prescription medicines only as told by your doctor.  Medicated creams and medicines that are inserted into the anus (suppositories) may  be used or applied as told.  Exercise often.  Go to the bathroom when you have the urge to poop (to have a bowel movement). Do not wait.  Avoid pushing too hard (straining) when you poop.  Keep the butt area dry and clean. Use wet toilet paper or moist paper towels.  Do not sit on the toilet for a long time. Contact a doctor if:  You have any of these:  Pain and swelling that do not get better with treatment or medicine.  Bleeding that will not stop.  Trouble pooping or you cannot poop.  Pain or swelling outside the area of the hemorrhoids. This information is not intended to replace advice given to you by your health care provider. Make sure you discuss any questions you have with your health care provider. Document Released: 12/30/2007 Document Revised: 08/28/2015 Document Reviewed: 12/04/2014 Elsevier Interactive Patient Education  2017 Elsevier Inc.  Back Pain, Adult Back pain is very common. The pain often  gets better over time. The cause of back pain is usually not dangerous. Most people can learn to manage their back pain on their own. Follow these instructions at home: Watch your back pain for any changes. The following actions may help to lessen any pain you are feeling:  Stay active. Start with short walks on flat ground if you can. Try to walk farther each day.  Exercise regularly as told by your doctor. Exercise helps your back heal faster. It also helps avoid future injury by keeping your muscles strong and flexible.  Do not sit, drive, or stand in one place for more than 30 minutes.  Do not stay in bed. Resting more than 1-2 days can slow down your recovery.  Be careful when you bend or lift an object. Use good form when lifting:  Bend at your knees.  Keep the object close to your body.  Do not twist.  Sleep on a firm mattress. Lie on your side, and bend your knees. If you lie on your back, put a pillow under your knees.  Take medicines only as told by your doctor.  Put ice on the injured area.  Put ice in a plastic bag.  Place a towel between your skin and the bag.  Leave the ice on for 20 minutes, 2-3 times a day for the first 2-3 days. After that, you can switch between ice and heat packs.  Avoid feeling anxious or stressed. Find good ways to deal with stress, such as exercise.  Maintain a healthy weight. Extra weight puts stress on your back. Contact a doctor if:  You have pain that does not go away with rest or medicine.  You have worsening pain that goes down into your legs or buttocks.  You have pain that does not get better in one week.  You have pain at night.  You lose weight.  You have a fever or chills. Get help right away if:  You cannot control when you poop (bowel movement) or pee (urinate).  Your arms or legs feel weak.  Your arms or legs lose feeling (numbness).  You feel sick to your stomach (nauseous) or throw up (vomit).  You have  belly (abdominal) pain.  You feel like you may pass out (faint). This information is not intended to replace advice given to you by your health care provider. Make sure you discuss any questions you have with your health care provider. Document Released: 09/08/2007 Document Revised: 08/28/2015 Document Reviewed: 07/24/2013 Elsevier  Interactive Patient Education  2017 Elsevier Inc.      Edwina Barth, MD [facility]

## 2019-01-22 ENCOUNTER — Ambulatory Visit: Payer: BLUE CROSS/BLUE SHIELD | Admitting: Emergency Medicine

## 2019-01-23 ENCOUNTER — Encounter: Payer: Self-pay | Admitting: Emergency Medicine

## 2019-07-22 ENCOUNTER — Encounter (HOSPITAL_COMMUNITY): Payer: Self-pay | Admitting: Emergency Medicine

## 2019-07-22 ENCOUNTER — Other Ambulatory Visit: Payer: Self-pay

## 2019-07-22 ENCOUNTER — Emergency Department (HOSPITAL_COMMUNITY)
Admission: EM | Admit: 2019-07-22 | Discharge: 2019-07-22 | Disposition: A | Discharge status: Home / Self Care | Payer: Self-pay | Attending: Emergency Medicine | Admitting: Emergency Medicine

## 2019-07-22 DIAGNOSIS — F17210 Nicotine dependence, cigarettes, uncomplicated: Secondary | ICD-10-CM | POA: Insufficient documentation

## 2019-07-22 DIAGNOSIS — R112 Nausea with vomiting, unspecified: Secondary | ICD-10-CM

## 2019-07-22 DIAGNOSIS — R630 Anorexia: Secondary | ICD-10-CM

## 2019-07-22 DIAGNOSIS — R1013 Epigastric pain: Secondary | ICD-10-CM

## 2019-07-22 LAB — COMPREHENSIVE METABOLIC PANEL
ALT: 16 U/L (ref 0–44)
AST: 19 U/L (ref 15–41)
Albumin: 5 g/dL (ref 3.5–5.0)
Alkaline Phosphatase: 57 U/L (ref 38–126)
Anion gap: 9 (ref 5–15)
BUN: 8 mg/dL (ref 6–20)
CO2: 30 mmol/L (ref 22–32)
Calcium: 9.6 mg/dL (ref 8.9–10.3)
Chloride: 100 mmol/L (ref 98–111)
Creatinine, Ser: 1.11 mg/dL (ref 0.61–1.24)
GFR calc Af Amer: >60 mL/min (ref >60)
GFR calc non Af Amer: >60 mL/min (ref >60)
Glucose, Bld: 118 mg/dL — ABNORMAL HIGH (ref 70–99)
Potassium: 4.4 mmol/L (ref 3.5–5.1)
Sodium: 139 mmol/L (ref 135–145)
Total Bilirubin: 0.6 mg/dL (ref 0.3–1.2)
Total Protein: 8.3 g/dL — ABNORMAL HIGH (ref 6.5–8.1)

## 2019-07-22 LAB — CBC
HCT: 47.9 % (ref 39.0–52.0)
Hemoglobin: 16.4 g/dL (ref 13.0–17.0)
MCH: 32.3 pg (ref 26.0–34.0)
MCHC: 34.2 g/dL (ref 30.0–36.0)
MCV: 94.5 fL (ref 80.0–100.0)
Platelets: 291 10*3/uL (ref 150–400)
RBC: 5.07 MIL/uL (ref 4.22–5.81)
RDW: 12.9 % (ref 11.5–15.5)
WBC: 10.5 10*3/uL (ref 4.0–10.5)
nRBC: 0 % (ref 0.0–0.2)

## 2019-07-22 LAB — LIPASE, BLOOD: Lipase: 51 U/L (ref 11–51)

## 2019-07-22 MED ORDER — ONDANSETRON HCL 4 MG/2ML IJ SOLN
4.0000 mg | Freq: Once | INTRAMUSCULAR | Status: AC
  Administered 2019-07-22: 4 mg via INTRAVENOUS
  Filled 2019-07-22: qty 2

## 2019-07-22 MED ORDER — ONDANSETRON 4 MG PO TBDP
4.0000 mg | ORAL_TABLET | Freq: Once | ORAL | Status: AC | PRN
  Administered 2019-07-22: 4 mg via ORAL
  Filled 2019-07-22: qty 1

## 2019-07-22 MED ORDER — ONDANSETRON HCL 4 MG PO TABS: 4.0000 mg | ORAL_TABLET | Freq: Four times a day (QID) | ORAL | 0 refills | Status: AC | PRN

## 2019-07-22 MED ORDER — SODIUM CHLORIDE 0.9 % IV BOLUS
1000.0000 mL | Freq: Once | INTRAVENOUS | Status: AC
Start: 2019-07-22 — End: 2019-07-22
  Administered 2019-07-22: 1000 mL via INTRAVENOUS

## 2019-07-22 MED ORDER — ALUM & MAG HYDROXIDE-SIMETH 200-200-20 MG/5ML PO SUSP
30.0000 mL | Freq: Once | ORAL | Status: AC
  Administered 2019-07-22: 30 mL via ORAL
  Filled 2019-07-22: qty 30

## 2019-07-22 MED ORDER — PANTOPRAZOLE SODIUM 40 MG PO TBEC
40.0000 mg | DELAYED_RELEASE_TABLET | Freq: Once | ORAL | Status: AC
  Administered 2019-07-22: 40 mg via ORAL
  Filled 2019-07-22: qty 1

## 2019-07-22 MED ORDER — SODIUM CHLORIDE 0.9% FLUSH
3.0000 mL | Freq: Once | INTRAVENOUS | Status: AC
  Administered 2019-07-22: 3 mL via INTRAVENOUS

## 2019-07-22 NOTE — ED Provider Notes (Signed)
[facility]-EMERGENCY DEPT Provider Note   CSN: [id number] Arrival date & time: 07/22/19  0419   History Chief Complaint  Patient presents with  . Abdominal Pain    Darryl Freeman is a 43 y.o. male.  The history is provided by the patient.  Abdominal Pain He complains of anorexia for the last week.  During this time, he has had nausea and vomiting.  He notes that food is not taste right.  He is a cigarette smoker and states that the cigarettes do not taste right.  If he lays down, he gets a feeling like a knot in his epigastric area and he has to sit up.  He feels that he is getting dehydrated because he has not been able to drink anything.  He denies fever, chills, sweats.  However, he has noted intolerance for air conditioning.  He denies arthralgias or myalgias.  He denies any sick contacts.  History reviewed. No pertinent past medical history.  Patient Active Problem List   Diagnosis Date Noted  . Strain of lumbar region 07/29/2016  . Hemorrhoids, external 07/29/2016  . Routine general medical examination at a health care facility 04/03/2014    History reviewed. No pertinent surgical history.     Family History  Problem Relation Age of Onset  . Diabetes Mother   . Lung cancer Father     Social History   Tobacco Use  . Smoking status: Current Some Day Smoker    Packs/day: 0.50    Years: 4.00    Pack years: 2.00    Types: Cigarettes  . Smokeless tobacco: Never Used  Substance Use Topics  . Alcohol use: Yes    Comment: occasionally  . Drug use: No    Home Medications Prior to Admission medications   Medication Sig Start Date End Date Taking? Authorizing Provider  cyclobenzaprine (FLEXERIL) 10 MG tablet Take 1 tablet (10 mg total) by mouth 3 (three) times daily as needed for muscle spasms. 07/29/16   Georgina Quint, MD  hydrocortisone (ANUSOL-HC) 2.5 % rectal cream Place 1 application rectally 2 (two) times daily. 07/29/16   Georgina Quint, MD    Allergies    Patient has no known allergies.  Review of Systems   Review of Systems  Gastrointestinal: Positive for abdominal pain.  All other systems reviewed and are negative.   Physical Exam Updated Vital Signs BP (!) 160/107   Pulse (!) 109   Temp 98.3 F (36.8 C) (Oral)   Resp 20   Ht 5\' 9"  (1.753 m)   Wt 63.5 kg   SpO2 100%   BMI 20.67 kg/m   Physical Exam Vitals and nursing note reviewed.   44 year old male, resting comfortably and in no acute distress. Vital signs are significant for elevated blood pressure and heart rate. Oxygen saturation is 100%, which is normal. Head is normocephalic and atraumatic. PERRLA, EOMI. Oropharynx is clear.  There is no scleral icterus. Neck is nontender and supple without adenopathy or JVD. Back is nontender and there is no CVA tenderness. Lungs are clear without rales, wheezes, or rhonchi. Chest is nontender. Heart has regular rate and rhythm without murmur. Abdomen is soft, flat, with mild epigastric tenderness.  There is no rebound or guarding.  There are no masses or hepatosplenomegaly and peristalsis is hypoactive. Extremities have no cyanosis or edema, full range of motion is present. Skin is warm and dry without rash. Neurologic: Mental status is normal, cranial nerves are  intact, there are no motor or sensory deficits.  ED Results / Procedures / Treatments   Labs (all labs ordered are listed, but only abnormal results are displayed) Labs Reviewed  COMPREHENSIVE METABOLIC PANEL - Abnormal; Notable for the following components:      Result Value   Glucose, Bld 118 (*)    Total Protein 8.3 (*)    All other components within normal limits  LIPASE, BLOOD  CBC  URINALYSIS, ROUTINE W REFLEX MICROSCOPIC   Procedures Procedures   Medications Ordered in ED Medications  sodium chloride flush (NS) 0.9 % injection 3 mL (has no administration in time range)  ondansetron (ZOFRAN-ODT) disintegrating tablet 4  mg (4 mg Oral Given 07/22/19 0457)    ED Course  I have reviewed the triage vital signs and the nursing notes.  Pertinent lab results that were available during my care of the patient were reviewed by me and considered in my medical decision making (see chart for details).  MDM Rules/Calculators/A&P Nausea, epigastric discomfort, altered sense of taste.  Possible viral gastritis, possible early hepatitis, possible peptic ulcer disease.  He will be given IV fluids, ondansetron, GI cocktail, and will check screening labs.  Old records are reviewed, and he has no relevant past visits.  Labs are unremarkable.  Normal transaminases.  He feels much better after above-noted treatment.  At this point, he is felt to most likely have peptic ulcer disease.  He is discharged with prescription for ondansetron and is advised to use over-the-counter omeprazole.  Referred to gastroenterology for further outpatient work-up and treatment.  Final Clinical Impression(s) / ED Diagnoses Final diagnoses:  Epigastric pain  Anorexia  Non-intractable vomiting with nausea, unspecified vomiting type    Rx / DC Orders ED Discharge Orders         Ordered    ondansetron (ZOFRAN) 4 MG tablet  Every 6 hours PRN     07/22/19 6294           Delora Fuel, MD 76/54/65 0800

## 2019-07-22 NOTE — ED Triage Notes (Signed)
Patient complaining of knot in his abdomen and nausea. Patient states this started 4 days ago. Patient states he can not put nothing on his abdomen.

## 2019-07-22 NOTE — Discharge Instructions (Addendum)
Take omeprazole (Prilosec OTC) once a day. Return if symptoms are getting worse. °

## 2019-07-23 ENCOUNTER — Other Ambulatory Visit: Payer: Self-pay

## 2019-07-23 ENCOUNTER — Encounter (HOSPITAL_COMMUNITY): Payer: Self-pay

## 2019-07-23 ENCOUNTER — Emergency Department (HOSPITAL_COMMUNITY): Payer: Self-pay

## 2019-07-23 ENCOUNTER — Emergency Department (HOSPITAL_COMMUNITY)
Admission: EM | Admit: 2019-07-23 | Discharge: 2019-07-23 | Disposition: A | Discharge status: Home / Self Care | Payer: Self-pay | Attending: Emergency Medicine | Admitting: Emergency Medicine

## 2019-07-23 DIAGNOSIS — Z20822 Contact with and (suspected) exposure to covid-19: Secondary | ICD-10-CM | POA: Insufficient documentation

## 2019-07-23 DIAGNOSIS — R0789 Other chest pain: Secondary | ICD-10-CM | POA: Insufficient documentation

## 2019-07-23 DIAGNOSIS — R0602 Shortness of breath: Secondary | ICD-10-CM | POA: Insufficient documentation

## 2019-07-23 DIAGNOSIS — R112 Nausea with vomiting, unspecified: Secondary | ICD-10-CM | POA: Insufficient documentation

## 2019-07-23 DIAGNOSIS — R197 Diarrhea, unspecified: Secondary | ICD-10-CM | POA: Insufficient documentation

## 2019-07-23 DIAGNOSIS — J069 Acute upper respiratory infection, unspecified: Secondary | ICD-10-CM | POA: Insufficient documentation

## 2019-07-23 DIAGNOSIS — F17210 Nicotine dependence, cigarettes, uncomplicated: Secondary | ICD-10-CM | POA: Insufficient documentation

## 2019-07-23 DIAGNOSIS — M7918 Myalgia, other site: Secondary | ICD-10-CM | POA: Insufficient documentation

## 2019-07-23 LAB — URINALYSIS, ROUTINE W REFLEX MICROSCOPIC
Bilirubin Urine: NEGATIVE
Glucose, UA: NEGATIVE mg/dL
Hgb urine dipstick: NEGATIVE
Ketones, ur: NEGATIVE mg/dL
Leukocytes,Ua: NEGATIVE
Nitrite: NEGATIVE
Protein, ur: NEGATIVE mg/dL
Specific Gravity, Urine: 1.006 (ref 1.005–1.030)
pH: 8 (ref 5.0–8.0)

## 2019-07-23 LAB — CBC
HCT: 42.5 % (ref 39.0–52.0)
Hemoglobin: 14.7 g/dL (ref 13.0–17.0)
MCH: 33.3 pg (ref 26.0–34.0)
MCHC: 34.6 g/dL (ref 30.0–36.0)
MCV: 96.2 fL (ref 80.0–100.0)
Platelets: 259 10*3/uL (ref 150–400)
RBC: 4.42 MIL/uL (ref 4.22–5.81)
RDW: 12.9 % (ref 11.5–15.5)
WBC: 12.2 10*3/uL — ABNORMAL HIGH (ref 4.0–10.5)
nRBC: 0 % (ref 0.0–0.2)

## 2019-07-23 LAB — BASIC METABOLIC PANEL
Anion gap: 8 (ref 5–15)
BUN: 9 mg/dL (ref 6–20)
CO2: 29 mmol/L (ref 22–32)
Calcium: 9.1 mg/dL (ref 8.9–10.3)
Chloride: 101 mmol/L (ref 98–111)
Creatinine, Ser: 1.01 mg/dL (ref 0.61–1.24)
GFR calc Af Amer: >60 mL/min (ref >60)
GFR calc non Af Amer: >60 mL/min (ref >60)
Glucose, Bld: 115 mg/dL — ABNORMAL HIGH (ref 70–99)
Potassium: 4.3 mmol/L (ref 3.5–5.1)
Sodium: 138 mmol/L (ref 135–145)

## 2019-07-23 LAB — POC SARS CORONAVIRUS 2 AG -  ED: SARS Coronavirus 2 Ag: NEGATIVE

## 2019-07-23 LAB — HEPATIC FUNCTION PANEL
ALT: 15 U/L (ref 0–44)
AST: 19 U/L (ref 15–41)
Albumin: 4.5 g/dL (ref 3.5–5.0)
Alkaline Phosphatase: 51 U/L (ref 38–126)
Bilirubin, Direct: 0.1 mg/dL (ref 0.0–0.2)
Indirect Bilirubin: 0.5 mg/dL (ref 0.3–0.9)
Total Bilirubin: 0.6 mg/dL (ref 0.3–1.2)
Total Protein: 7.5 g/dL (ref 6.5–8.1)

## 2019-07-23 LAB — TROPONIN I (HIGH SENSITIVITY)
Troponin I (High Sensitivity): <2 ng/L (ref <18)
Troponin I (High Sensitivity): <2 ng/L (ref <18)

## 2019-07-23 MED ORDER — ONDANSETRON HCL 4 MG/2ML IJ SOLN
4.0000 mg | Freq: Once | INTRAMUSCULAR | Status: AC
  Administered 2019-07-23: 4 mg via INTRAVENOUS
  Filled 2019-07-23: qty 2

## 2019-07-23 MED ORDER — COLCHICINE 0.6 MG PO TABS: 0.6000 mg | ORAL_TABLET | Freq: Two times a day (BID) | ORAL | 0 refills | Status: AC

## 2019-07-23 MED ORDER — SODIUM CHLORIDE 0.9% FLUSH
3.0000 mL | Freq: Once | INTRAVENOUS | Status: AC
  Administered 2019-07-23: 3 mL via INTRAVENOUS

## 2019-07-23 MED ORDER — SODIUM CHLORIDE 0.9 % IV BOLUS (SEPSIS)
1000.0000 mL | Freq: Once | INTRAVENOUS | Status: AC
  Administered 2019-07-23: 1000 mL via INTRAVENOUS

## 2019-07-23 MED ORDER — IBUPROFEN 600 MG PO TABS: 600.0000 mg | ORAL_TABLET | Freq: Three times a day (TID) | ORAL | 0 refills | Status: AC

## 2019-07-23 MED ORDER — IBUPROFEN 800 MG PO TABS
800.0000 mg | ORAL_TABLET | Freq: Once | ORAL | Status: AC
  Administered 2019-07-23: 17:00:00 800 mg via ORAL
  Filled 2019-07-23: qty 1

## 2019-07-23 NOTE — Discharge Instructions (Addendum)
You were seen today for chest pain, body aches and chills. We believe you have a viral infection. Your covid test was negative. Your symptoms may be causing something called pericarditis which is inflammation in the fluid-filled sac that surrounds your heart. This is treated with anti inflammatory medications which have been prescribed to you to take. Please drink plenty of fluids and return to the ER if you have ANY new or worsened symptoms.

## 2019-07-23 NOTE — ED Provider Notes (Signed)
[facility]-EMERGENCY DEPT Provider Note   CSN: [id number] Arrival date & time: 07/23/19  1107     History Chief Complaint  Patient presents with  . Chest Pain  . Chills  . Shortness of Breath    Darryl Freeman is a 44 y.o. male.  Patient is a 44 year old gentleman with no significant past medical history presenting to the emergency department for chest pain, shortness of breath, abdominal upset, diarrhea, body aches and chills. Seen yesterday for similar symptoms and was given a GI cocktail and was improved.  Today he reports that the symptoms are no better and he is now having chest pain.  Reports the chest pain is sharp, worse with deep breaths and worse when he lays down.  Denies any fever, chills.  Reports abdominal upset without significant abdominal pain.  Is having nonbloody emesis.          History reviewed. No pertinent past medical history.  Patient Active Problem List   Diagnosis Date Noted  . Strain of lumbar region 07/29/2016  . Hemorrhoids, external 07/29/2016  . Routine general medical examination at a health care facility 04/03/2014    History reviewed. No pertinent surgical history.     Family History  Problem Relation Age of Onset  . Diabetes Mother   . Lung cancer Father     Social History   Tobacco Use  . Smoking status: Current Some Day Smoker    Packs/day: 0.50    Years: 4.00    Pack years: 2.00    Types: Cigarettes  . Smokeless tobacco: Never Used  Substance Use Topics  . Alcohol use: Yes    Comment: occasionally  . Drug use: No    Home Medications Prior to Admission medications   Medication Sig Start Date End Date Taking? Authorizing Provider  ondansetron (ZOFRAN) 4 MG tablet Take 1 tablet (4 mg total) by mouth every 6 (six) hours as needed for nausea. 07/22/19  Yes Dione Booze, MD  colchicine 0.6 MG tablet Take 1 tablet (0.6 mg total) by mouth 2 (two) times daily. 07/23/19 10/21/19  Ronnie Doss A, PA-C    ibuprofen (ADVIL) 600 MG tablet Take 1 tablet (600 mg total) by mouth 3 (three) times daily for 7 days. 07/23/19 07/30/19  Arlyn Dunning, PA-C    Allergies    Patient has no known allergies.  Review of Systems   Review of Systems  Constitutional: Positive for appetite change, chills, fatigue and fever.  HENT: Negative for congestion and sore throat.   Eyes: Negative for visual disturbance.  Respiratory: Positive for shortness of breath.   Cardiovascular: Positive for chest pain.  Gastrointestinal: Positive for abdominal pain, diarrhea, nausea and vomiting.  Genitourinary: Negative for dysuria.  Musculoskeletal: Positive for back pain.  Skin: Negative for rash and wound.  Allergic/Immunologic: Negative for immunocompromised state.  Neurological: Negative for dizziness, light-headedness and headaches.  Hematological: Does not bruise/bleed easily.    Physical Exam Updated Vital Signs BP (!) 165/90   Pulse (!) 48   Temp 98 F (36.7 C) (Oral)   Resp (!) 9   Ht 5\' 9"  (1.753 m)   Wt 63.5 kg   SpO2 100%   BMI 20.67 kg/m   Physical Exam Vitals and nursing note reviewed.  Constitutional:      General: He is not in acute distress.    Appearance: Normal appearance. He is well-developed. He is not ill-appearing, toxic-appearing or diaphoretic.  HENT:     Head: Normocephalic.  Eyes:     Conjunctiva/sclera: Conjunctivae normal.     Pupils: Pupils are equal, round, and reactive to light.  Cardiovascular:     Rate and Rhythm: Normal rate and regular rhythm.     Heart sounds: No friction rub.  Pulmonary:     Effort: Pulmonary effort is normal.     Breath sounds: Normal breath sounds.  Chest:     Chest wall: No mass or deformity.  Abdominal:     General: Bowel sounds are normal.     Palpations: Abdomen is soft.     Tenderness: There is no guarding or rebound.  Musculoskeletal:     Right lower leg: No edema.     Left lower leg: No edema.  Skin:    General: Skin is warm and  dry.  Neurological:     General: No focal deficit present.     Mental Status: He is alert.  Psychiatric:        Mood and Affect: Mood normal.     ED Results / Procedures / Treatments   Labs (all labs ordered are listed, but only abnormal results are displayed) Labs Reviewed  BASIC METABOLIC PANEL - Abnormal; Notable for the following components:      Result Value   Glucose, Bld 115 (*)    All other components within normal limits  CBC - Abnormal; Notable for the following components:   WBC 12.2 (*)    All other components within normal limits  URINALYSIS, ROUTINE W REFLEX MICROSCOPIC - Abnormal; Notable for the following components:   Color, Urine STRAW (*)    All other components within normal limits  HEPATIC FUNCTION PANEL  POC SARS CORONAVIRUS 2 AG -  ED  TROPONIN I (HIGH SENSITIVITY)  TROPONIN I (HIGH SENSITIVITY)    EKG EKG Interpretation  Date/Time:  Monday July 23 2019 11:29:47 EDT Ventricular Rate:  48 PR Interval:    QRS Duration: 135 QT Interval:  495 QTC Calculation: 443 R Axis:   59 Text Interpretation: Sinus rhythm IVCD, consider atypical RBBB ST elevation suggests acute pericarditis T waves are hyperacute Nonspecific ST abnormality No old tracing to compare Confirmed by Derwood Kaplan [phone]) on 07/23/2019 3:51:02 PM   Radiology DG Chest 2 View  Result Date: 07/23/2019 CLINICAL DATA:  Chest pain. EXAM: CHEST - 2 VIEW COMPARISON:  None. FINDINGS: The heart size and mediastinal contours are within normal limits. Both lungs are clear. No pneumothorax or pleural effusion is noted. The visualized skeletal structures are unremarkable. IMPRESSION: No active cardiopulmonary disease. Electronically Signed   By: Lupita Raider M.D.   On: 07/23/2019 11:59    Procedures Procedures (including critical care time)  Medications Ordered in ED Medications  sodium chloride flush (NS) 0.9 % injection 3 mL (3 mLs Intravenous Given 07/23/19 1334)  ondansetron (ZOFRAN)  injection 4 mg (4 mg Intravenous Given 07/23/19 1333)  sodium chloride 0.9 % bolus 1,000 mL (0 mLs Intravenous Stopped 07/23/19 1508)  ibuprofen (ADVIL) tablet 800 mg (800 mg Oral Given 07/23/19 1702)    ED Course  I have reviewed the triage vital signs and the nursing notes.  Pertinent labs & imaging results that were available during my care of the patient were reviewed by me and considered in my medical decision making (see chart for details).  Clinical Course as of Jul 23 1738  Mon Jul 23, 2019  1649 Patient presenting with viral type symptoms for 1 week and now pleuritic chest pain since this morning. Overall  well appearing, afebrile and hemodynamically stable. Labs are reassuring although he has a mild WBC count of 12.2. Suspect viral illness. Covid pending, chest xray reassuring. Trop <2 x2. EKG was ordered for suspicion of pericarditis given his chest pain. He had borderline ST elevations in anteriorly leads but not diffusely. He is overall doing well and would like to go home. Stable for d/c home. Will treat with NSAIDs given his symptoms for possible viral induced pericarditis. Discussed with Dr. Kathrynn Humble and plan agreed upon. Patient was advised of return precautions.    [KM]    Clinical Course User Index [KM] Kristine Royal   MDM Rules/Calculators/A&P                      Based on review of vitals, medical screening exam, lab work and/or imaging, there does not appear to be an acute, emergent etiology for the patient's symptoms. Counseled pt on good return precautions and encouraged both PCP and ED follow-up as needed.  Prior to discharge, I also discussed incidental imaging findings with patient in detail and advised appropriate, recommended follow-up in detail.  Clinical Impression: 1. Atypical chest pain   2. Viral URI     Disposition: Discharge  Prior to providing a prescription for a controlled substance, I independently reviewed the patient's recent prescription  history on the [place] [facility]. The patient had no recent or regular prescriptions and was deemed appropriate for a brief, less than 3 day prescription of narcotic for acute analgesia.  This note was prepared with assistance of Systems analyst. Occasional wrong-word or sound-a-like substitutions may have occurred due to the inherent limitations of voice recognition software.  Final Clinical Impression(s) / ED Diagnoses Final diagnoses:  Atypical chest pain  Viral URI    Rx / DC Orders ED Discharge Orders         Ordered    ibuprofen (ADVIL) 600 MG tablet  3 times daily     07/23/19 1739    colchicine 0.6 MG tablet  2 times daily     07/23/19 1739           Kristine Royal 07/23/19 1740    Varney Biles, MD 07/24/19 0840

## 2019-07-23 NOTE — ED Notes (Signed)
I called patient in lobby and outside for a room and no one responded

## 2019-07-23 NOTE — ED Triage Notes (Addendum)
Patient c/o left chest pain, SOB, and nausea, and chills x 3-4 days. Patient states chest pain is worse with a deep breath.  Patient was seen in the ED yesterday.

## 2021-03-11 ENCOUNTER — Other Ambulatory Visit: Payer: Self-pay

## 2021-03-11 ENCOUNTER — Encounter (HOSPITAL_BASED_OUTPATIENT_CLINIC_OR_DEPARTMENT_OTHER): Payer: Self-pay | Admitting: *Deleted

## 2021-03-11 ENCOUNTER — Emergency Department (HOSPITAL_BASED_OUTPATIENT_CLINIC_OR_DEPARTMENT_OTHER)
Admission: EM | Admit: 2021-03-11 | Discharge: 2021-03-11 | Disposition: A | Discharge status: Home / Self Care | Payer: Self-pay | Attending: Emergency Medicine | Admitting: Emergency Medicine

## 2021-03-11 ENCOUNTER — Ambulatory Visit
Admission: EM | Admit: 2021-03-11 | Discharge: 2021-03-11 | Disposition: A | Discharge status: Home / Self Care | Payer: Self-pay | Attending: Internal Medicine | Admitting: Internal Medicine

## 2021-03-11 ENCOUNTER — Emergency Department (HOSPITAL_BASED_OUTPATIENT_CLINIC_OR_DEPARTMENT_OTHER): Payer: Self-pay

## 2021-03-11 DIAGNOSIS — F17210 Nicotine dependence, cigarettes, uncomplicated: Secondary | ICD-10-CM | POA: Insufficient documentation

## 2021-03-11 DIAGNOSIS — R0789 Other chest pain: Secondary | ICD-10-CM | POA: Insufficient documentation

## 2021-03-11 DIAGNOSIS — R0602 Shortness of breath: Secondary | ICD-10-CM

## 2021-03-11 LAB — CBC
HCT: 38.2 % — ABNORMAL LOW (ref 39.0–52.0)
Hemoglobin: 13 g/dL (ref 13.0–17.0)
MCH: 31.7 pg (ref 26.0–34.0)
MCHC: 34 g/dL (ref 30.0–36.0)
MCV: 93.2 fL (ref 80.0–100.0)
Platelets: 250 10*3/uL (ref 150–400)
RBC: 4.1 MIL/uL — ABNORMAL LOW (ref 4.22–5.81)
RDW: 13.2 % (ref 11.5–15.5)
WBC: 8 10*3/uL (ref 4.0–10.5)
nRBC: 0 % (ref 0.0–0.2)

## 2021-03-11 LAB — BASIC METABOLIC PANEL
Anion gap: 5 (ref 5–15)
BUN: 15 mg/dL (ref 6–20)
CO2: 31 mmol/L (ref 22–32)
Calcium: 9.4 mg/dL (ref 8.9–10.3)
Chloride: 105 mmol/L (ref 98–111)
Creatinine, Ser: 1.16 mg/dL (ref 0.61–1.24)
GFR, Estimated: >60 mL/min (ref >60)
Glucose, Bld: 104 mg/dL — ABNORMAL HIGH (ref 70–99)
Potassium: 4.5 mmol/L (ref 3.5–5.1)
Sodium: 141 mmol/L (ref 135–145)

## 2021-03-11 LAB — TROPONIN I (HIGH SENSITIVITY): Troponin I (High Sensitivity): <2 ng/L (ref <18)

## 2021-03-11 NOTE — ED Provider Notes (Signed)
[facility] EMERGENCY DEPT Provider Note   CSN: [id number] Arrival date & time: 03/11/21  1555     History Chief Complaint  Patient presents with   Chest Pain    Darryl Freeman is a 45 y.o. male.  The history is provided by the patient.  Chest Pain Pain location:  L chest Pain quality: sharp and stabbing   Pain radiates to:  Does not radiate Pain severity:  Moderate Onset quality:  Sudden Duration: For multiple minutes this morning. Timing:  Constant Progression:  Resolved Chronicity:  Recurrent Context comment:  Patient reports he woke up this morning and had a sudden onset of severe pain in the left side of his chest that was worse with breathing Relieved by: He laid back down and tried to breathe shallowly and it went away. Worsened by:  Deep breathing Ineffective treatments:  None tried Associated symptoms: no abdominal pain, no back pain, no cough, no diaphoresis, no fever, no lower extremity edema, no nausea, no shortness of breath, no vomiting and no weakness   Risk factors comment:  Patient has a history of having intermittent chest pain like this since he has been 9.  He does smoke cigarettes daily denies any drug use.  Takes no medications and has no known medical problems.     History reviewed. No pertinent past medical history.  Patient Active Problem List   Diagnosis Date Noted   Strain of lumbar region 07/29/2016   Hemorrhoids, external 07/29/2016   Routine general medical examination at a health care facility 04/03/2014    History reviewed. No pertinent surgical history.     Family History  Problem Relation Age of Onset   Diabetes Mother    Lung cancer Father     Social History   Tobacco Use   Smoking status: Some Days    Packs/day: 0.50    Years: 4.00    Pack years: 2.00    Types: Cigarettes   Smokeless tobacco: Never  Vaping Use   Vaping Use: Never used  Substance Use Topics   Alcohol use: Yes    Comment: occasionally    Drug use: No    Home Medications Prior to Admission medications   Medication Sig Start Date End Date Taking? Authorizing Provider  colchicine 0.6 MG tablet Take 1 tablet (0.6 mg total) by mouth 2 (two) times daily. 07/23/19 10/21/19  Ronnie Doss A, PA-C  ondansetron (ZOFRAN) 4 MG tablet Take 1 tablet (4 mg total) by mouth every 6 (six) hours as needed for nausea. 07/22/19   Dione Booze, MD    Allergies    Patient has no known allergies.  Review of Systems   Review of Systems  Constitutional:  Negative for diaphoresis and fever.  Respiratory:  Negative for cough and shortness of breath.   Cardiovascular:  Positive for chest pain.  Gastrointestinal:  Negative for abdominal pain, nausea and vomiting.  Musculoskeletal:  Negative for back pain.       He has noticed a swelling on his clavicle which has been there for a while.  It occasionally is tender but no known injury.  No decreased range of motion of the shoulder.  Neurological:  Negative for weakness.  All other systems reviewed and are negative.  Physical Exam Updated Vital Signs BP 119/79 (BP Location: Right Arm)   Pulse 69   Temp 98.4 F (36.9 C)   Resp 12   Ht 5\' 9"  (1.753 m)   Wt 66.7 kg   SpO2  99%   BMI 21.71 kg/m   Physical Exam Vitals and nursing note reviewed.  Constitutional:      General: He is not in acute distress.    Appearance: He is well-developed.  HENT:     Head: Normocephalic and atraumatic.  Eyes:     Conjunctiva/sclera: Conjunctivae normal.     Pupils: Pupils are equal, round, and reactive to light.  Cardiovascular:     Rate and Rhythm: Normal rate and regular rhythm.     Heart sounds: No murmur heard. Pulmonary:     Effort: Pulmonary effort is normal. No respiratory distress.     Breath sounds: Normal breath sounds. No wheezing or rales.  Chest:     Chest wall: No tenderness.  Abdominal:     General: There is no distension.     Palpations: Abdomen is soft.     Tenderness: There is no  abdominal tenderness. There is no guarding or rebound.  Musculoskeletal:        General: No tenderness. Normal range of motion.       Arms:     Cervical back: Normal range of motion and neck supple.  Skin:    General: Skin is warm and dry.     Capillary Refill: Capillary refill takes less than 2 seconds.     Findings: No erythema or rash.  Neurological:     Mental Status: He is alert and oriented to person, place, and time. Mental status is at baseline.  Psychiatric:        Mood and Affect: Mood normal.        Behavior: Behavior normal.    ED Results / Procedures / Treatments   Labs (all labs ordered are listed, but only abnormal results are displayed) Labs Reviewed  BASIC METABOLIC PANEL - Abnormal; Notable for the following components:      Result Value   Glucose, Bld 104 (*)    All other components within normal limits  CBC - Abnormal; Notable for the following components:   RBC 4.10 (*)    HCT 38.2 (*)    All other components within normal limits  TROPONIN I (HIGH SENSITIVITY)    EKG EKG Interpretation  Date/Time:  Wednesday March 11 2021 16:02:56 EST Ventricular Rate:  69 PR Interval:  174 QRS Duration: 82 QT Interval:  394 QTC Calculation: 422 R Axis:   82 Text Interpretation: Normal sinus rhythm Biatrial enlargement Left ventricular hypertrophy No significant change since last tracing Confirmed by Gwyneth Sprout ([id number]) on 03/11/2021 4:50:15 PM  Radiology DG Chest Portable 1 View  Result Date: 03/11/2021 CLINICAL DATA:  Chest pain EXAM: PORTABLE CHEST 1 VIEW COMPARISON:  Chest radiograph 07/23/2019 FINDINGS: The cardiomediastinal silhouette is normal. There is no focal consolidation or pulmonary edema. There is no pleural effusion or pneumothorax There is no acute osseous abnormality. IMPRESSION: No radiographic evidence of acute cardiopulmonary process. Electronically Signed   By: Lesia Hausen M.D.   On: 03/11/2021 16:54    Procedures Procedures    Medications Ordered in ED Medications - No data to display  ED Course  I have reviewed the triage vital signs and the nursing notes.  Pertinent labs & imaging results that were available during my care of the patient were reviewed by me and considered in my medical decision making (see chart for details).    MDM Rules/Calculators/A&P  Patient is a 45 year old male who is well-appearing and currently has no complaints.  He did have a pleuritic type chest pain this morning which it seems that he gets intermittently for years.  He reports he normally gets it every 2 or 3 months.  The pain is resolved and he has no pain at this time.  Low suspicion for PE as patient is not tachycardic is satting 100% on room air and has low risk other than tobacco use.  Concern for possible spontaneous pneumothorax versus pleurisy or costochondritis.  He has no infectious symptoms at this time he has no wheezing on exam.  He was seen at urgent care but given his EKG and his complaint they sent him here for further care.  Patient EKG has no significant findings today compared to his old EKGs.  He has normal blood pressure here no history of hypertension.  He does have a thin chest wall which is probably cause for his increased voltage.  Chest x-ray without evidence of pneumothorax today and the area on his clavicle is not apparent on x-ray.  No evidence of bony fracture or abnormality.  Could be a cyst. CBC, BMP and troponin are all within normal limits.  Suspect this is chest wall pain or costochondritis.  Patient is otherwise well-appearing counseled and tobacco cessation.  Patient discharged home.  MDM   Amount and/or Complexity of Data Reviewed Clinical lab tests: ordered and reviewed Tests in the radiology section of CPT: ordered and reviewed Tests in the medicine section of CPT: ordered and reviewed Independent visualization of images, tracings, or specimens: yes    Final  Clinical Impression(s) / ED Diagnoses Final diagnoses:  Chest wall pain    Rx / DC Orders ED Discharge Orders     None        Gwyneth Sprout, MD 03/11/21 1736

## 2021-03-11 NOTE — ED Notes (Signed)
RN provided AVS using Teachback Method. Patient verbalizes understanding of Discharge Instructions. Opportunity for Questioning and Answers were provided by RN. Patient Discharged from ED ambulatory to Home with Family. ? ?

## 2021-03-11 NOTE — Discharge Instructions (Addendum)
All of your x-rays and labs were normal today.  If you have any further pain you can take Tylenol or ibuprofen.

## 2021-03-11 NOTE — ED Provider Notes (Signed)
[facility]    CSN: [id number] Arrival date & time: 03/11/21  1357      History   Chief Complaint No chief complaint on file.   HPI Darryl Freeman is a 45 y.o. male.   Patient presents to urgent care for further evaluation of chest pain and "left shoulder mass".  Patient reports that he does not currently have any chest pain but last felt the chest pain a few hours prior to arrival.  Patient reports the chest pain is sharp and intermittent.  Chest pain occurs in the left side of the chest and does not radiate.  He reports that he has had some intermittent shortness of breath and it is hard to take a deep breath due to the sharp pain.  Pain also occurs with exertion and with movement especially when "rolling over in the bed".  Denies any injury to the chest.  He does report that he had this same chest pain in the months prior as well as when he was a child but no documented cardiac history.  Patient also reports that he has a knot on his left shoulder that has been present for a few months.  He reports that it popped up after he had a COVID-vaccine in that same shoulder.  Denies any apparent injury.  Denies pain with range of motion and patient has full range of motion.  Patient reports that pain only occurs when not it is palpated.  Patient has not taken any medications to help alleviate pain or symptoms.       History reviewed. No pertinent past medical history.  Patient Active Problem List   Diagnosis Date Noted   Strain of lumbar region 07/29/2016   Hemorrhoids, external 07/29/2016   Routine general medical examination at a health care facility 04/03/2014    History reviewed. No pertinent surgical history.     Home Medications    Prior to Admission medications   Medication Sig Start Date End Date Taking? Authorizing Provider  colchicine 0.6 MG tablet Take 1 tablet (0.6 mg total) by mouth 2 (two) times daily. 07/23/19 10/21/19  Ronnie Doss A, PA-C   ondansetron (ZOFRAN) 4 MG tablet Take 1 tablet (4 mg total) by mouth every 6 (six) hours as needed for nausea. 07/22/19   Dione Booze, MD    Family History Family History  Problem Relation Age of Onset   Diabetes Mother    Lung cancer Father     Social History Social History   Tobacco Use   Smoking status: Some Days    Packs/day: 0.50    Years: 4.00    Pack years: 2.00    Types: Cigarettes   Smokeless tobacco: Never  Vaping Use   Vaping Use: Never used  Substance Use Topics   Alcohol use: Yes    Comment: occasionally   Drug use: No     Allergies   Patient has no known allergies.   Review of Systems Review of Systems Per HPI  Physical Exam Triage Vital Signs ED Triage Vitals  Enc Vitals Group     BP 03/11/21 1447 108/66     Pulse Rate 03/11/21 1447 67     Resp 03/11/21 1447 18     Temp 03/11/21 1447 98.4 F (36.9 C)     Temp Source 03/11/21 1447 Oral     SpO2 03/11/21 1447 96 %     Weight --      Height --  Head Circumference --      Peak Flow --      Pain Score 03/11/21 1445 4     Pain Loc --      Pain Edu? --      Excl. in GC? --    No data found.  Updated Vital Signs BP 108/66 (BP Location: Left Arm)   Pulse 67   Temp 98.4 F (36.9 C) (Oral)   Resp 18   SpO2 96%   Visual Acuity Right Eye Distance:   Left Eye Distance:   Bilateral Distance:    Right Eye Near:   Left Eye Near:    Bilateral Near:     Physical Exam Constitutional:      General: He is not in acute distress.    Appearance: Normal appearance. He is not toxic-appearing or diaphoretic.  HENT:     Head: Normocephalic and atraumatic.  Eyes:     Extraocular Movements: Extraocular movements intact.     Conjunctiva/sclera: Conjunctivae normal.  Cardiovascular:     Rate and Rhythm: Normal rate and regular rhythm.     Pulses: Normal pulses.     Heart sounds: Normal heart sounds.  Pulmonary:     Effort: Pulmonary effort is normal. No respiratory distress.     Breath  sounds: Normal breath sounds.  Musculoskeletal:     Right shoulder: Normal.     Left shoulder: Deformity and tenderness present. No bony tenderness or crepitus. Normal range of motion. Normal strength. Normal pulse.     Comments: It appears that patient has protrusion located at approximately left acromion process.  Pain to palpation.  Full range of motion.  Neurovascular intact.  Neurological:     General: No focal deficit present.     Mental Status: He is alert and oriented to person, place, and time. Mental status is at baseline.  Psychiatric:        Mood and Affect: Mood normal.        Behavior: Behavior normal.        Thought Content: Thought content normal.        Judgment: Judgment normal.     UC Treatments / Results  Labs (all labs ordered are listed, but only abnormal results are displayed) Labs Reviewed - No data to display  EKG   Radiology No results found.  Procedures Procedures (including critical care time)  Medications Ordered in UC Medications - No data to display  Initial Impression / Assessment and Plan / UC Course  I have reviewed the triage vital signs and the nursing notes.  Pertinent labs & imaging results that were available during my care of the patient were reviewed by me and considered in my medical decision making (see chart for details).     EKG completed that was not acutely remarkable.  Patient is experiencing sharp pains with deep breaths, shortness of breath, chest pain with exertion.  This warrants further evaluation and management at the hospital.  Advised patient that he would need to go to the hospital for further evaluation and management of chest pain as well as his left shoulder pain.  Patient was agreeable with plan.  Suggested EMS transport but patient declined.  Risks associated with not going to hospital via EMS were discussed with patient.  Patient had friend here to transport him to the hospital.  Vital signs are stable at  discharge. Final Clinical Impressions(s) / UC Diagnoses   Final diagnoses:  Other chest pain  Shortness of breath  Discharge Instructions      Please go to the hospital as soon as you leave urgent care for further evaluation and management.    ED Prescriptions   None    PDMP not reviewed this encounter.   Gustavus Bryant, [place] 03/11/21 1555

## 2021-03-11 NOTE — Discharge Instructions (Signed)
Please go to the hospital as soon as you leave urgent care for further evaluation and management. 

## 2021-03-11 NOTE — ED Triage Notes (Signed)
Patient states when he woke up he had some chest pains. He states when deep breath it feels like a sharp pain.   He states he has a knot on his left shoulder for a few months. It hurts to touch.   Denies meds

## 2021-03-11 NOTE — ED Triage Notes (Signed)
States awoke this am with sharp chest pain and difficult to take a deep breath. Points to left breast area. Denies having any nausea or vomiting. Denies having diaphoresis with episode.

## 2021-04-09 ENCOUNTER — Encounter: Payer: Self-pay | Admitting: Emergency Medicine

## 2021-04-09 ENCOUNTER — Ambulatory Visit
Admission: EM | Admit: 2021-04-09 | Discharge: 2021-04-09 | Disposition: A | Discharge status: Home / Self Care | Payer: PRIVATE HEALTH INSURANCE | Attending: Internal Medicine | Admitting: Internal Medicine

## 2021-04-09 ENCOUNTER — Other Ambulatory Visit: Payer: Self-pay

## 2021-04-09 DIAGNOSIS — J029 Acute pharyngitis, unspecified: Secondary | ICD-10-CM

## 2021-04-09 DIAGNOSIS — Z20822 Contact with and (suspected) exposure to covid-19: Secondary | ICD-10-CM

## 2021-04-09 DIAGNOSIS — B349 Viral infection, unspecified: Secondary | ICD-10-CM

## 2021-04-09 LAB — POCT INFLUENZA A/B
Influenza A, POC: NEGATIVE
Influenza B, POC: NEGATIVE

## 2021-04-09 MED ORDER — BENZONATATE 100 MG PO CAPS
100.0000 mg | ORAL_CAPSULE | Freq: Three times a day (TID) | ORAL | 0 refills | Status: AC | PRN
Start: 2021-04-09 — End: ?

## 2021-04-09 NOTE — ED Provider Notes (Signed)
[facility]    CSN: [id number] Arrival date & time: 04/09/21  1424      History   Chief Complaint Chief Complaint  Patient presents with   Generalized Body Aches    HPI Darryl Freeman is a 46 y.o. male.   Patient presents with a 2-day history of generalized body aches, weakness, nonproductive cough, sore throat, decreased appetite.  She denies any known sick contacts.  Denies any known fevers but states that they felt feverish.  Patient has taken Tylenol Cold and flu with minimal improvement in symptoms.  Denies chest pain, shortness of breath, ear pain, nausea, vomiting, diarrhea, abdominal pain.    History reviewed. No pertinent past medical history.  Patient Active Problem List   Diagnosis Date Noted   Strain of lumbar region 07/29/2016   Hemorrhoids, external 07/29/2016   Routine general medical examination at a health care facility 04/03/2014    History reviewed. No pertinent surgical history.     Home Medications    Prior to Admission medications   Medication Sig Start Date End Date Taking? Authorizing Provider  benzonatate (TESSALON) 100 MG capsule Take 1 capsule (100 mg total) by mouth every 8 (eight) hours as needed for cough. 04/09/21  Yes Valinda Fedie, Rolly Salter E, FNP  colchicine 0.6 MG tablet Take 1 tablet (0.6 mg total) by mouth 2 (two) times daily. 07/23/19 10/21/19  Ronnie Doss A, PA-C  ondansetron (ZOFRAN) 4 MG tablet Take 1 tablet (4 mg total) by mouth every 6 (six) hours as needed for nausea. 07/22/19   Dione Booze, MD    Family History Family History  Problem Relation Age of Onset   Diabetes Mother    Lung cancer Father     Social History Social History   Tobacco Use   Smoking status: Some Days    Packs/day: 0.50    Years: 4.00    Pack years: 2.00    Types: Cigarettes   Smokeless tobacco: Never  Vaping Use   Vaping Use: Never used  Substance Use Topics   Alcohol use: Yes    Comment: occasionally   Drug use: No     Allergies    Patient has no known allergies.   Review of Systems Review of Systems Per HPI  Physical Exam Triage Vital Signs ED Triage Vitals [04/09/21 1441]  Enc Vitals Group     BP (!) 148/93     Pulse Rate (!) 56     Resp 16     Temp 98.1 F (36.7 C)     Temp Source Oral     SpO2 97 %     Weight      Height      Head Circumference      Peak Flow      Pain Score 6     Pain Loc      Pain Edu?      Excl. in GC?    No data found.  Updated Vital Signs BP (!) 148/93 (BP Location: Left Arm)    Pulse (!) 56    Temp 98.1 F (36.7 C) (Oral)    Resp 16    SpO2 97%   Visual Acuity Right Eye Distance:   Left Eye Distance:   Bilateral Distance:    Right Eye Near:   Left Eye Near:    Bilateral Near:     Physical Exam Constitutional:      General: He is not in acute distress.    Appearance: Normal appearance.  He is not toxic-appearing or diaphoretic.  HENT:     Head: Normocephalic and atraumatic.     Right Ear: Tympanic membrane and ear canal normal.     Left Ear: Tympanic membrane and ear canal normal.     Nose: Congestion present.     Mouth/Throat:     Mouth: Mucous membranes are moist.     Pharynx: Posterior oropharyngeal erythema present.  Eyes:     Extraocular Movements: Extraocular movements intact.     Conjunctiva/sclera: Conjunctivae normal.     Pupils: Pupils are equal, round, and reactive to light.  Cardiovascular:     Rate and Rhythm: Normal rate and regular rhythm.     Pulses: Normal pulses.     Heart sounds: Normal heart sounds.  Pulmonary:     Effort: Pulmonary effort is normal. No respiratory distress.     Breath sounds: Normal breath sounds. No stridor. No wheezing, rhonchi or rales.  Abdominal:     General: Abdomen is flat. Bowel sounds are normal.     Palpations: Abdomen is soft.  Musculoskeletal:        General: Normal range of motion.     Cervical back: Normal range of motion.  Skin:    General: Skin is warm and dry.  Neurological:     General: No  focal deficit present.     Mental Status: He is alert and oriented to person, place, and time. Mental status is at baseline.  Psychiatric:        Mood and Affect: Mood normal.        Behavior: Behavior normal.     UC Treatments / Results  Labs (all labs ordered are listed, but only abnormal results are displayed) Labs Reviewed  NOVEL CORONAVIRUS, NAA  POCT INFLUENZA A/B  POCT RAPID STREP A (OFFICE)    EKG   Radiology No results found.  Procedures Procedures (including critical care time)  Medications Ordered in UC Medications - No data to display  Initial Impression / Assessment and Plan / UC Course  I have reviewed the triage vital signs and the nursing notes.  Pertinent labs & imaging results that were available during my care of the patient were reviewed by me and considered in my medical decision making (see chart for details).     Patient's symptoms appear viral in etiology.  Rapid flu was negative.  Patient refused strep test.  COVID-19 test pending.  Discussed symptom management with patient.  Benzonatate prescribed to take as needed for cough.  Discussed strict return precautions.  Patient verbalized understanding and was agreeable with plan. Final Clinical Impressions(s) / UC Diagnoses   Final diagnoses:  Viral illness  Sore throat  Encounter for laboratory testing for COVID-19 virus     Discharge Instructions      It appears that you have a viral illness.  Rapid flu was negative.  COVID test is pending.     ED Prescriptions     Medication Sig Dispense Auth. Provider   benzonatate (TESSALON) 100 MG capsule Take 1 capsule (100 mg total) by mouth every 8 (eight) hours as needed for cough. 21 capsule McBride, Acie Fredrickson, [place]      PDMP not reviewed this encounter.   Gustavus Bryant, [place] 04/09/21 [phone]

## 2021-04-09 NOTE — Discharge Instructions (Signed)
It appears that you have a viral illness.  Rapid flu was negative.  COVID test is pending.

## 2021-04-09 NOTE — ED Triage Notes (Signed)
Tuesday began having generalized body aches, weakness, cough, sore throat. Decreased appetite

## 2021-04-09 NOTE — ED Notes (Signed)
Patient jerked away and told me he could not do the strep test without throwing up everywhere. Stated he did not want it. Notified provider of inability to perform test

## 2021-04-10 LAB — NOVEL CORONAVIRUS, NAA: SARS-CoV-2, NAA: NOT DETECTED

## 2021-04-10 LAB — SARS-COV-2, NAA 2 DAY TAT

## 2021-06-08 ENCOUNTER — Ambulatory Visit
Admission: EM | Admit: 2021-06-08 | Discharge: 2021-06-08 | Disposition: A | Discharge status: Home / Self Care | Payer: PRIVATE HEALTH INSURANCE | Attending: Internal Medicine | Admitting: Internal Medicine

## 2021-06-08 ENCOUNTER — Encounter: Payer: Self-pay | Admitting: Emergency Medicine

## 2021-06-08 ENCOUNTER — Other Ambulatory Visit: Payer: Self-pay

## 2021-06-08 DIAGNOSIS — R0781 Pleurodynia: Secondary | ICD-10-CM | POA: Diagnosis not present

## 2021-06-08 MED ORDER — IBUPROFEN 600 MG PO TABS: 600.0000 mg | ORAL_TABLET | Freq: Four times a day (QID) | ORAL | 0 refills | Status: DC | PRN

## 2021-06-08 NOTE — Discharge Instructions (Signed)
It appears that you may have a muscle strain and/or rib pain.  Please take ibuprofen as prescribed.  Do not take any additional ibuprofen, Advil, Aleve.  Also apply ice application.  Follow-up if symptoms persist or worsens. ?

## 2021-06-08 NOTE — ED Triage Notes (Signed)
Patient c/o left sided flank pain x 1 week.  No apparent injury.  Notices with repative motion.  Patient denies any OTC meds.  ?

## 2021-06-08 NOTE — ED Provider Notes (Signed)
?[facility] ? ? ? ?CSN: [id number] ?Arrival date & time: 06/08/21  0846 ? ? ?  ? ?History   ?Chief Complaint ?Chief Complaint  ?Patient presents with  ? Left Sided Flank Pain  ? ? ?HPI ?Darryl Freeman is a 46 y.o. male.  ? ?Patient presents with left rib pain that has been present for approximately 1 week.  Denies any apparent injury.  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  Patient has not taken any medications to alleviate symptoms.  Denies any shortness of breath. ? ? ? ?History reviewed. No pertinent past medical history. ? ?Patient Active Problem List  ? Diagnosis Date Noted  ? Strain of lumbar region 07/29/2016  ? Hemorrhoids, external 07/29/2016  ? Routine general medical examination at a health care facility 04/03/2014  ? ? ?History reviewed. No pertinent surgical history. ? ? ? ? ?Home Medications   ? ?Prior to Admission medications   ?Medication Sig Start Date End Date Taking? Authorizing Provider  ?ibuprofen (ADVIL) 600 MG tablet Take 1 tablet (600 mg total) by mouth every 6 (six) hours as needed for mild pain or moderate pain. 06/08/21  Yes Taylore Hinde, Acie Fredrickson, FNP  ?benzonatate (TESSALON) 100 MG capsule Take 1 capsule (100 mg total) by mouth every 8 (eight) hours as needed for cough. 04/09/21   Gustavus Bryant, FNP  ?colchicine 0.6 MG tablet Take 1 tablet (0.6 mg total) by mouth 2 (two) times daily. 07/23/19 10/21/19  Arlyn Dunning, PA-C  ?ondansetron (ZOFRAN) 4 MG tablet Take 1 tablet (4 mg total) by mouth every 6 (six) hours as needed for nausea. 07/22/19   Dione Booze, MD  ? ? ?Family History ?Family History  ?Problem Relation Age of Onset  ? Diabetes Mother   ? Lung cancer Father   ? ? ?Social History ?Social History  ? ?Tobacco Use  ? Smoking status: Some Days  ?  Packs/day: 0.50  ?  Years: 4.00  ?  Pack years: 2.00  ?  Types: Cigarettes  ? Smokeless tobacco: Never  ?Vaping Use  ? Vaping  Use: Never used  ?Substance Use Topics  ? Alcohol use: Yes  ?  Comment: occasionally  ? Drug use: No  ? ? ? ?Allergies   ?Patient has no known allergies. ? ? ?Review of Systems ?Review of Systems ?Per HPI ? ?Physical Exam ?Triage Vital Signs ?ED Triage Vitals  ?Enc Vitals Group  ?   BP 06/08/21 0935 129/84  ?   Pulse Rate 06/08/21 0935 (!) 54  ?   Resp 06/08/21 0935 18  ?   Temp 06/08/21 0935 98.2 ?F (36.8 ?C)  ?   Temp Source 06/08/21 0935 Oral  ?   SpO2 06/08/21 0935 98 %  ?   Weight 06/08/21 0937 147 lb 0.8 oz (66.7 kg)  ?   Height 06/08/21 0937 5\' 9"  (1.753 m)  ?   Head Circumference --   ?   Peak Flow --   ?   Pain Score 06/08/21 0936 6  ?   Pain Loc --   ?   Pain Edu? --   ?   Excl. in GC? --   ? ?No data found. ? ?Updated Vital Signs ?BP 129/84 (BP Location: Left Arm)   Pulse (!) 54   Temp 98.2 ?F (36.8 ?C) (Oral)   Resp 18  Ht 5\' 9"  (1.753 m)   Wt 147 lb 0.8 oz (66.7 kg)   SpO2 98%   BMI 21.72 kg/m?  ? ?Visual Acuity ?Right Eye Distance:   ?Left Eye Distance:   ?Bilateral Distance:   ? ?Right Eye Near:   ?Left Eye Near:    ?Bilateral Near:    ? ?Physical Exam ?Constitutional:   ?   General: He is not in acute distress. ?   Appearance: Normal appearance. He is not toxic-appearing or diaphoretic.  ?HENT:  ?   Head: Normocephalic and atraumatic.  ?Eyes:  ?   Extraocular Movements: Extraocular movements intact.  ?   Conjunctiva/sclera: Conjunctivae normal.  ?Pulmonary:  ?   Effort: Pulmonary effort is normal.  ?Chest:  ?   Chest wall: Tenderness present.  ?   Comments: Tenderness to palpation to left lateral mid ribs.  No obvious swelling, bruising, erythema noted.  Pain with range of motion. ?Neurological:  ?   General: No focal deficit present.  ?   Mental Status: He is alert and oriented to person, place, and time. Mental status is at baseline.  ?Psychiatric:     ?   Mood and Affect: Mood normal.     ?   Behavior: Behavior normal.     ?   Thought Content: Thought content normal.     ?   Judgment:  Judgment normal.  ? ? ? ?UC Treatments / Results  ?Labs ?(all labs ordered are listed, but only abnormal results are displayed) ?Labs Reviewed - No data to display ? ?EKG ? ? ?Radiology ?No results found. ? ?Procedures ?Procedures (including critical care time) ? ?Medications Ordered in UC ?Medications - No data to display ? ?Initial Impression / Assessment and Plan / UC Course  ?I have reviewed the triage vital signs and the nursing notes. ? ?Pertinent labs & imaging results that were available during my care of the patient were reviewed by me and considered in my medical decision making (see chart for details). ? ?  ? ?Suspect patient's pain could be from left rib inflammation or left muscular strain.  Will treat with NSAIDs.  Ice application.  Discussed supportive care.  Patient to follow-up if symptoms persist or worsen. ?Final Clinical Impressions(s) / UC Diagnoses  ? ?Final diagnoses:  ?Rib pain on left side  ? ? ? ?Discharge Instructions   ? ?  ?It appears that you may have a muscle strain and/or rib pain.  Please take ibuprofen as prescribed.  Do not take any additional ibuprofen, Advil, Aleve.  Also apply ice application.  Follow-up if symptoms persist or worsens. ? ? ? ?ED Prescriptions   ? ? Medication Sig Dispense Auth. Provider  ? ibuprofen (ADVIL) 600 MG tablet Take 1 tablet (600 mg total) by mouth every 6 (six) hours as needed for mild pain or moderate pain. 30 tablet E, Ervin Knack  ? ?  ? ?PDMP not reviewed this encounter. ?  ?[place], Gustavus Bryant ?06/08/21 08/08/21 ? ?

## 2021-10-21 ENCOUNTER — Other Ambulatory Visit: Payer: Self-pay

## 2021-10-21 ENCOUNTER — Ambulatory Visit (INDEPENDENT_AMBULATORY_CARE_PROVIDER_SITE_OTHER): Payer: BC Managed Care – PPO

## 2021-10-21 ENCOUNTER — Ambulatory Visit
Admission: EM | Admit: 2021-10-21 | Discharge: 2021-10-21 | Disposition: A | Discharge status: Home / Self Care | Payer: BC Managed Care – PPO | Attending: Internal Medicine | Admitting: Internal Medicine

## 2021-10-21 DIAGNOSIS — M25512 Pain in left shoulder: Secondary | ICD-10-CM

## 2021-10-21 DIAGNOSIS — R5381 Other malaise: Secondary | ICD-10-CM

## 2021-10-21 DIAGNOSIS — R0602 Shortness of breath: Secondary | ICD-10-CM

## 2021-10-21 DIAGNOSIS — R5383 Other fatigue: Secondary | ICD-10-CM | POA: Diagnosis not present

## 2021-10-21 DIAGNOSIS — M79602 Pain in left arm: Secondary | ICD-10-CM | POA: Diagnosis not present

## 2021-10-21 MED ORDER — IBUPROFEN 600 MG PO TABS
600.0000 mg | ORAL_TABLET | Freq: Four times a day (QID) | ORAL | 0 refills | Status: AC | PRN
Start: 2021-10-21 — End: ?

## 2021-10-21 MED ORDER — ALBUTEROL SULFATE HFA 108 (90 BASE) MCG/ACT IN AERS: 1.0000 | INHALATION_SPRAY | Freq: Four times a day (QID) | RESPIRATORY_TRACT | 0 refills | Status: AC | PRN

## 2021-10-21 NOTE — ED Triage Notes (Signed)
Pt c/o fatigue, malaise, left hand has limited ROM x2-3 weeks thinks it is job related.

## 2021-10-21 NOTE — ED Provider Notes (Signed)
[facility]    CSN: [id number] Arrival date & time: 10/21/21  1516      History   Chief Complaint Chief Complaint  Patient presents with   Fatigue    HPI Darryl Freeman is a 46 y.o. male.   Patient presents with 2 different chief complaints today.  Patient reports that he has shortness of breath that started this morning upon awakening.  He is currently having shortness of breath at this time.  Denies any associated cough, fever, upper respiratory symptoms.  Denies any associated chest pain, dizziness, headache, blurred vision, nausea, vomiting.  Patient reports that this has occurred before a few months prior and has been intermittent.  Denies history of any chronic lung disease including COPD or asthma.  He does state that he smokes cigarettes occasionally.   Patient also presenting with left shoulder pain.  He was seen previously for this as it is chronic in nature and has been present for multiple months.  He is attributing his left shoulder pain to repetitive movements that he has to do at work.  Denies any obvious injury.  Denies numbness or tingling.  Patient reports that pain starts at his left shoulder and radiates all the way down to his fingers.  He has not taken any medications for pain.     History reviewed. No pertinent past medical history.  Patient Active Problem List   Diagnosis Date Noted   Strain of lumbar region 07/29/2016   Hemorrhoids, external 07/29/2016   Routine general medical examination at a health care facility 04/03/2014    History reviewed. No pertinent surgical history.     Home Medications    Prior to Admission medications   Medication Sig Start Date End Date Taking? Authorizing Provider  albuterol (VENTOLIN HFA) 108 (90 Base) MCG/ACT inhaler Inhale 1-2 puffs into the lungs every 6 (six) hours as needed for wheezing or shortness of breath. 10/21/21  Yes Mikena Masoner, Rolly Salter E, FNP  ibuprofen (ADVIL) 600 MG tablet Take 1 tablet (600 mg  total) by mouth every 6 (six) hours as needed for mild pain. 10/21/21  Yes Talisha Erby, Rolly Salter E, FNP  benzonatate (TESSALON) 100 MG capsule Take 1 capsule (100 mg total) by mouth every 8 (eight) hours as needed for cough. 04/09/21   Gustavus Bryant, FNP  colchicine 0.6 MG tablet Take 1 tablet (0.6 mg total) by mouth 2 (two) times daily. 07/23/19 10/21/19  Ronnie Doss A, PA-C  ondansetron (ZOFRAN) 4 MG tablet Take 1 tablet (4 mg total) by mouth every 6 (six) hours as needed for nausea. 07/22/19   Dione Booze, MD    Family History Family History  Problem Relation Age of Onset   Diabetes Mother    Lung cancer Father     Social History Social History   Tobacco Use   Smoking status: Some Days    Packs/day: 0.50    Years: 4.00    Total pack years: 2.00    Types: Cigarettes   Smokeless tobacco: Never  Vaping Use   Vaping Use: Never used  Substance Use Topics   Alcohol use: Yes    Comment: occasionally   Drug use: No     Allergies   Patient has no known allergies.   Review of Systems Review of Systems Per HPI  Physical Exam Triage Vital Signs ED Triage Vitals [10/21/21 1544]  Enc Vitals Group     BP (!) 153/99     Pulse Rate (!) 55  Resp 18     Temp 98 F (36.7 C)     Temp Source Oral     SpO2 98 %     Weight      Height      Head Circumference      Peak Flow      Pain Score 0     Pain Loc      Pain Edu?      Excl. in GC?    No data found.  Updated Vital Signs BP (!) 153/99 (BP Location: Left Arm)   Pulse (!) 55   Temp 98 F (36.7 C) (Oral)   Resp 18   SpO2 98%   Visual Acuity Right Eye Distance:   Left Eye Distance:   Bilateral Distance:    Right Eye Near:   Left Eye Near:    Bilateral Near:     Physical Exam Constitutional:      General: He is not in acute distress.    Appearance: Normal appearance. He is not toxic-appearing or diaphoretic.  HENT:     Head: Normocephalic and atraumatic.  Eyes:     Extraocular Movements: Extraocular movements  intact.     Conjunctiva/sclera: Conjunctivae normal.     Pupils: Pupils are equal, round, and reactive to light.  Cardiovascular:     Rate and Rhythm: Normal rate and regular rhythm.     Pulses: Normal pulses.     Heart sounds: Normal heart sounds.  Pulmonary:     Effort: Pulmonary effort is normal. No respiratory distress.     Breath sounds: Normal breath sounds.  Abdominal:     General: Bowel sounds are normal. There is no distension.     Tenderness: There is no abdominal tenderness.  Musculoskeletal:     Comments: Tenderness to palpation generalized throughout left shoulder.  Pain with abduction at 45 degrees.  No tenderness to palpation throughout remainder of arm, hand, fingers.  Grip strength is 5/5.  Neurovascular intact.  No obvious swelling, discoloration, abrasions, lacerations noted.  Neurological:     General: No focal deficit present.     Mental Status: He is alert and oriented to person, place, and time. Mental status is at baseline.     Cranial Nerves: Cranial nerves 2-12 are intact.     Sensory: Sensation is intact.     Motor: Motor function is intact.     Coordination: Coordination is intact.     Gait: Gait is intact.  Psychiatric:        Mood and Affect: Mood normal.        Behavior: Behavior normal.        Thought Content: Thought content normal.        Judgment: Judgment normal.      UC Treatments / Results  Labs (all labs ordered are listed, but only abnormal results are displayed) Labs Reviewed  CBC  COMPREHENSIVE METABOLIC PANEL    EKG   Radiology DG Chest 2 View  Result Date: 10/21/2021 CLINICAL DATA:  Fatigue, malaise CHEST - 2 VIEW COMPARISON:  03/11/2021 FINDINGS: Cardiac and mediastinal contours are within normal limits. No focal pulmonary opacity. No pleural effusion or pneumothorax. No acute osseous abnormality. IMPRESSION: No acute cardiopulmonary process. Electronically Signed   By: Wiliam Ke M.D.   On: 10/21/2021 16:44   DG Shoulder  Left  Result Date: 10/21/2021 CLINICAL DATA:  Left shoulder pain and limited range of motion for 2-3 weeks. EXAM: Left shoulder radiographs, three views. COMPARISON:  None Available. FINDINGS: The joint spaces are maintained. No acute fracture or bone lesion. No abnormal soft tissue calcifications. The visualized left ribs are intact. IMPRESSION: No acute bony findings. Electronically Signed   By: Rudie Meyer M.D.   On: 10/21/2021 16:42    Procedures Procedures (including critical care time)  Medications Ordered in UC Medications - No data to display  Initial Impression / Assessment and Plan / UC Course  I have reviewed the triage vital signs and the nursing notes.  Pertinent labs & imaging results that were available during my care of the patient were reviewed by me and considered in my medical decision making (see chart for details).     Patient complaining of shortness of breath.  Chest x-ray was negative for any acute cardiopulmonary process.  EKG was completed that showed sinus bradycardia.  Unremarkable compared to previous EKGs.  Patient is not in any acute distress, is not tachypneic, and vital signs are stable so do not think that emergent evaluation at the hospital is necessary.  Given that vital signs are stable, there is low concern for PE.  Unsure exact etiology of patient's shortness of breath.  Will send albuterol inhaler to take as needed for shortness of breath.  CMP and CBC pending as well.  Patient advised to follow-up if symptoms persist or worsen and was given strict ER precautions.  I do think the patient would benefit from seeing pulmonology and/or cardiology given history of being seen multiple times at urgent care and ED for chest pain and intermittent shortness of breath.  Patient was given contact information for these and advised to follow-up.  Left shoulder x-ray was negative for any acute bony abnormality.  Suspect the pain could be muscular in nature.  Will treat  with NSAIDs as there does not appear to be any obvious contraindications to NSAIDs on exam or in patient's chart.  Patient also advised to use ice application.  Advised patient to avoid repetitive movements.  Patient advised to follow-up with orthopedist given chronicity of pain at provided contact information.  Discussed return precautions for all chief complaints today.  Patient verbalized understanding and was agreeable with plan. Final Clinical Impressions(s) / UC Diagnoses   Final diagnoses:  Shortness of breath  Left arm pain     Discharge Instructions      Your chest x-ray and EKG were unremarkable.  You have prescribed an albuterol inhaler to take as needed for shortness of breath.  Basic blood work is also pending.  We will call if there are any abnormalities.  You have been provided with contact information for cardiology and pulmonology follow-up for further evaluation and management. you will call yourself to schedule these appointments.  If they say that you need official referral, please follow-up with primary care doctor for this referral.  X-ray of your shoulder was normal as well.  You have been prescribed ibuprofen to take as needed for pain.  Also recommend ice application.  Do not take any additional ibuprofen, Advil, Aleve while taking this ibuprofen.  Follow-up with orthopedist at provided contact information for further evaluation and management.    ED Prescriptions     Medication Sig Dispense Auth. Provider   ibuprofen (ADVIL) 600 MG tablet Take 1 tablet (600 mg total) by mouth every 6 (six) hours as needed for mild pain. 30 tablet Wilcox, Rolly Salter E, [place]   albuterol (VENTOLIN HFA) 108 (90 Base) MCG/ACT inhaler Inhale 1-2 puffs into the lungs every 6 (six) hours  as needed for wheezing or shortness of breath. 1 each Gustavus Bryant, [place]      PDMP not reviewed this encounter.   Gustavus Bryant, [place] 10/21/21 1725

## 2021-10-21 NOTE — Discharge Instructions (Signed)
Your chest x-ray and EKG were unremarkable.  You have prescribed an albuterol inhaler to take as needed for shortness of breath.  Basic blood work is also pending.  We will call if there are any abnormalities.  You have been provided with contact information for cardiology and pulmonology follow-up for further evaluation and management. you will call yourself to schedule these appointments.  If they say that you need official referral, please follow-up with primary care doctor for this referral.  X-ray of your shoulder was normal as well.  You have been prescribed ibuprofen to take as needed for pain.  Also recommend ice application.  Do not take any additional ibuprofen, Advil, Aleve while taking this ibuprofen.  Follow-up with orthopedist at provided contact information for further evaluation and management.

## 2021-10-22 LAB — COMPREHENSIVE METABOLIC PANEL
ALT: 17 IU/L (ref 0–44)
AST: 25 IU/L (ref 0–40)
Albumin/Globulin Ratio: 1.8 (ref 1.2–2.2)
Albumin: 4.9 g/dL (ref 4.1–5.1)
Alkaline Phosphatase: 60 IU/L (ref 44–121)
BUN/Creatinine Ratio: 15 (ref 9–20)
BUN: 15 mg/dL (ref 6–24)
Bilirubin Total: 0.8 mg/dL (ref 0.0–1.2)
CO2: 25 mmol/L (ref 20–29)
Calcium: 9.8 mg/dL (ref 8.7–10.2)
Chloride: 100 mmol/L (ref 96–106)
Creatinine, Ser: 1 mg/dL (ref 0.76–1.27)
Globulin, Total: 2.8 g/dL (ref 1.5–4.5)
Glucose: 84 mg/dL (ref 70–99)
Potassium: 4.1 mmol/L (ref 3.5–5.2)
Sodium: 137 mmol/L (ref 134–144)
Total Protein: 7.7 g/dL (ref 6.0–8.5)
eGFR: 93 mL/min/{1.73_m2} (ref >59)

## 2021-10-22 LAB — CBC
Hematocrit: 39 % (ref 37.5–51.0)
Hemoglobin: 13.6 g/dL (ref 13.0–17.7)
MCH: 31.4 pg (ref 26.6–33.0)
MCHC: 34.9 g/dL (ref 31.5–35.7)
MCV: 90 fL (ref 79–97)
Platelets: 255 10*3/uL (ref 150–450)
RBC: 4.33 x10E6/uL (ref 4.14–5.80)
RDW: 12.6 % (ref 11.6–15.4)
WBC: 8.3 10*3/uL (ref 3.4–10.8)

## 2023-03-02 ENCOUNTER — Other Ambulatory Visit: Payer: Self-pay

## 2023-03-02 ENCOUNTER — Encounter: Payer: Self-pay | Admitting: Emergency Medicine

## 2023-03-02 ENCOUNTER — Ambulatory Visit: Admission: EM | Admit: 2023-03-02 | Discharge: 2023-03-02 | Discharge status: Against Medical Advice | Payer: 59

## 2023-03-02 NOTE — ED Notes (Signed)
Called for patient in lobby with room ready. Not present in lobby. TC to patient went to VM. Will call again in lobby and via TC.

## 2023-03-02 NOTE — ED Triage Notes (Signed)
Pt reports bilateral cysts to axillary area. Pt reports only changes to routine prior to cysts is adding iron and vitamin C supplements. Donated plasma on 11/24.

## 2023-03-02 NOTE — ED Notes (Signed)
Called for pt in lobby due to room being ready. 2nd TC to pt went to VM.

## 2023-03-13 ENCOUNTER — Ambulatory Visit
Admission: EM | Admit: 2023-03-13 | Discharge: 2023-03-13 | Disposition: A | Discharge status: Home / Self Care | Payer: 59

## 2023-03-13 DIAGNOSIS — L02411 Cutaneous abscess of right axilla: Secondary | ICD-10-CM

## 2023-03-13 MED ORDER — DOXYCYCLINE HYCLATE 100 MG PO CAPS: 100.0000 mg | ORAL_CAPSULE | Freq: Two times a day (BID) | ORAL | 0 refills | Status: AC

## 2023-03-13 NOTE — ED Triage Notes (Signed)
"  I have some lumps/bumps under my arm's (arm pits)". "Nothing new I have done, I have given plasma recently is all and taken some Iron pills". "I do not usually shave or wax underneath the arms (in arm pits) but I did shave the area's a little after I noticed them to see what was going on". No fever.

## 2023-03-13 NOTE — Discharge Instructions (Signed)
Warm compresses bilateral arm pits twice daily. Continue to drain area over the next couple of days. Antibacterial soap Avoid deodorant until area are healed.  Return to clinic if worsening of symptoms or no resolution after antibiotics

## 2023-03-13 NOTE — ED Provider Notes (Signed)
[facility]    CSN: [id number] Arrival date & time: 03/13/23  4132      History   Chief Complaint Chief Complaint  Patient presents with   Skin Problem    HPI Darryl Freeman is a 47 y.o. male.   Symptoms x 2 weeks with the right worsening over the last couple of days.  Painful with arms down.  Patient reporting bilateral axillar abscesses.  The left axillary has drained.  The right has a pus head.  He has taken nothing at home.       History reviewed. No pertinent past medical history.  Patient Active Problem List   Diagnosis Date Noted   Strain of lumbar region 07/29/2016   Hemorrhoids, external 07/29/2016   Routine general medical examination at a health care facility 04/03/2014    History reviewed. No pertinent surgical history.     Home Medications    Prior to Admission medications   Medication Sig Start Date End Date Taking? Authorizing Provider  doxycycline (VIBRAMYCIN) 100 MG capsule Take 1 capsule (100 mg total) by mouth 2 (two) times daily. 03/13/23  Yes Beckham Capistran, Linde Gillis, NP  Ferrous Sulfate (IRON PO) Take by mouth.   Yes [provider]  albuterol (VENTOLIN HFA) 108 (90 Base) MCG/ACT inhaler Inhale 1-2 puffs into the lungs every 6 (six) hours as needed for wheezing or shortness of breath. Patient not taking: Reported on 03/02/2023 10/21/21   Gustavus Bryant, FNP  benzonatate (TESSALON) 100 MG capsule Take 1 capsule (100 mg total) by mouth every 8 (eight) hours as needed for cough. Patient not taking: Reported on 03/02/2023 04/09/21   Gustavus Bryant, FNP  colchicine 0.6 MG tablet Take 1 tablet (0.6 mg total) by mouth 2 (two) times daily. Patient not taking: Reported on 03/02/2023 07/23/19 10/21/19  Arlyn Dunning, PA-C  ibuprofen (ADVIL) 600 MG tablet Take 1 tablet (600 mg total) by mouth every 6 (six) hours as needed for mild pain. Patient not taking: Reported on 03/02/2023 10/21/21   Gustavus Bryant, FNP  ondansetron (ZOFRAN) 4 MG tablet  Take 1 tablet (4 mg total) by mouth every 6 (six) hours as needed for nausea. Patient not taking: Reported on 03/02/2023 07/22/19   Dione Booze, MD    Family History Family History  Problem Relation Age of Onset   Diabetes Mother    Lung cancer Father     Social History Social History   Tobacco Use   Smoking status: Some Days    Current packs/day: 0.50    Average packs/day: 0.5 packs/day for 4.0 years (2.0 ttl pk-yrs)    Types: Cigarettes   Smokeless tobacco: Never  Vaping Use   Vaping status: Never Used  Substance Use Topics   Alcohol use: Yes    Comment: occasionally   Drug use: No     Allergies   Patient has no known allergies.   Review of Systems Review of Systems  Skin:  Positive for wound.       Bilateral axilla bumps     Physical Exam Triage Vital Signs ED Triage Vitals  Encounter Vitals Group     BP 03/13/23 1000 125/77     Systolic BP Percentile --      Diastolic BP Percentile --      Pulse Rate 03/13/23 1000 73     Resp 03/13/23 1000 (!) 73     Temp 03/13/23 1000 98.1 F (36.7 C)     Temp Source 03/13/23  1000 Oral     SpO2 03/13/23 1000 98 %     Weight 03/13/23 0959 147 lb 0.8 oz (66.7 kg)     Height 03/13/23 0959 5\' 9"  (1.753 m)     Head Circumference --      Peak Flow --      Pain Score 03/13/23 0956 7     Pain Loc --      Pain Education --      Exclude from Growth Chart --    No data found.  Updated Vital Signs BP 125/77 (BP Location: Left Arm)   Pulse 73   Temp 98.1 F (36.7 C) (Oral)   Resp (!) 73   Ht 5\' 9"  (1.753 m)   Wt 147 lb 0.8 oz (66.7 kg)   SpO2 98%   BMI 21.72 kg/m   Visual Acuity Right Eye Distance:   Left Eye Distance:   Bilateral Distance:    Right Eye Near:   Left Eye Near:    Bilateral Near:     Physical Exam Skin:    Comments: Left axilla with 2 small abscesses that have previously drained.  Non infective looking Right axilla with 1 abscess measuring 1in x 1in.  Purulent center noted, erythemic and  warm to touch.  Induration  with fluctuant center      UC Treatments / Results  Labs (all labs ordered are listed, but only abnormal results are displayed) Labs Reviewed - No data to display  EKG   Radiology No results found.  Procedures Procedures (including critical care time)  Medications Ordered in UC Medications - No data to display  Initial Impression / Assessment and Plan / UC Course  I have reviewed the triage vital signs and the nursing notes.  Pertinent labs & imaging results that were available during my care of the patient were reviewed by me and considered in my medical decision making (see chart for details).   Findinds consistent with axillary abscesses most likely secondary to blocked pores from his current deodarant.  Treated with antibiotic and self care information reviewed.  Patient to return if no resolution with abx . Final Clinical Impressions(s) / UC Diagnoses   Final diagnoses:  Abscesses of both axillae     Discharge Instructions      Warm compresses bilateral arm pits twice daily. Continue to drain area over the next couple of days. Antibacterial soap Avoid deodorant until area are healed.  Return to clinic if worsening of symptoms or no resolution after antibiotics      ED Prescriptions     Medication Sig Dispense Auth. Provider   doxycycline (VIBRAMYCIN) 100 MG capsule Take 1 capsule (100 mg total) by mouth 2 (two) times daily. 20 capsule Camden Mazzaferro, Linde Gillis, NP      PDMP not reviewed this encounter.   Nelda Marseille, NP 03/13/23 1131

## 2023-04-13 IMAGING — DX DG CHEST 1V PORT
1 series · 2 of 2 positions shown · non-contrast
Comparison: Chest radiograph 07/23/2019

CLINICAL DATA: Chest pain

EXAM:
PORTABLE CHEST 1 VIEW

[Series 1: chest · 0.14mm/px · 2 of 2 slices shown]
[im 1/2]
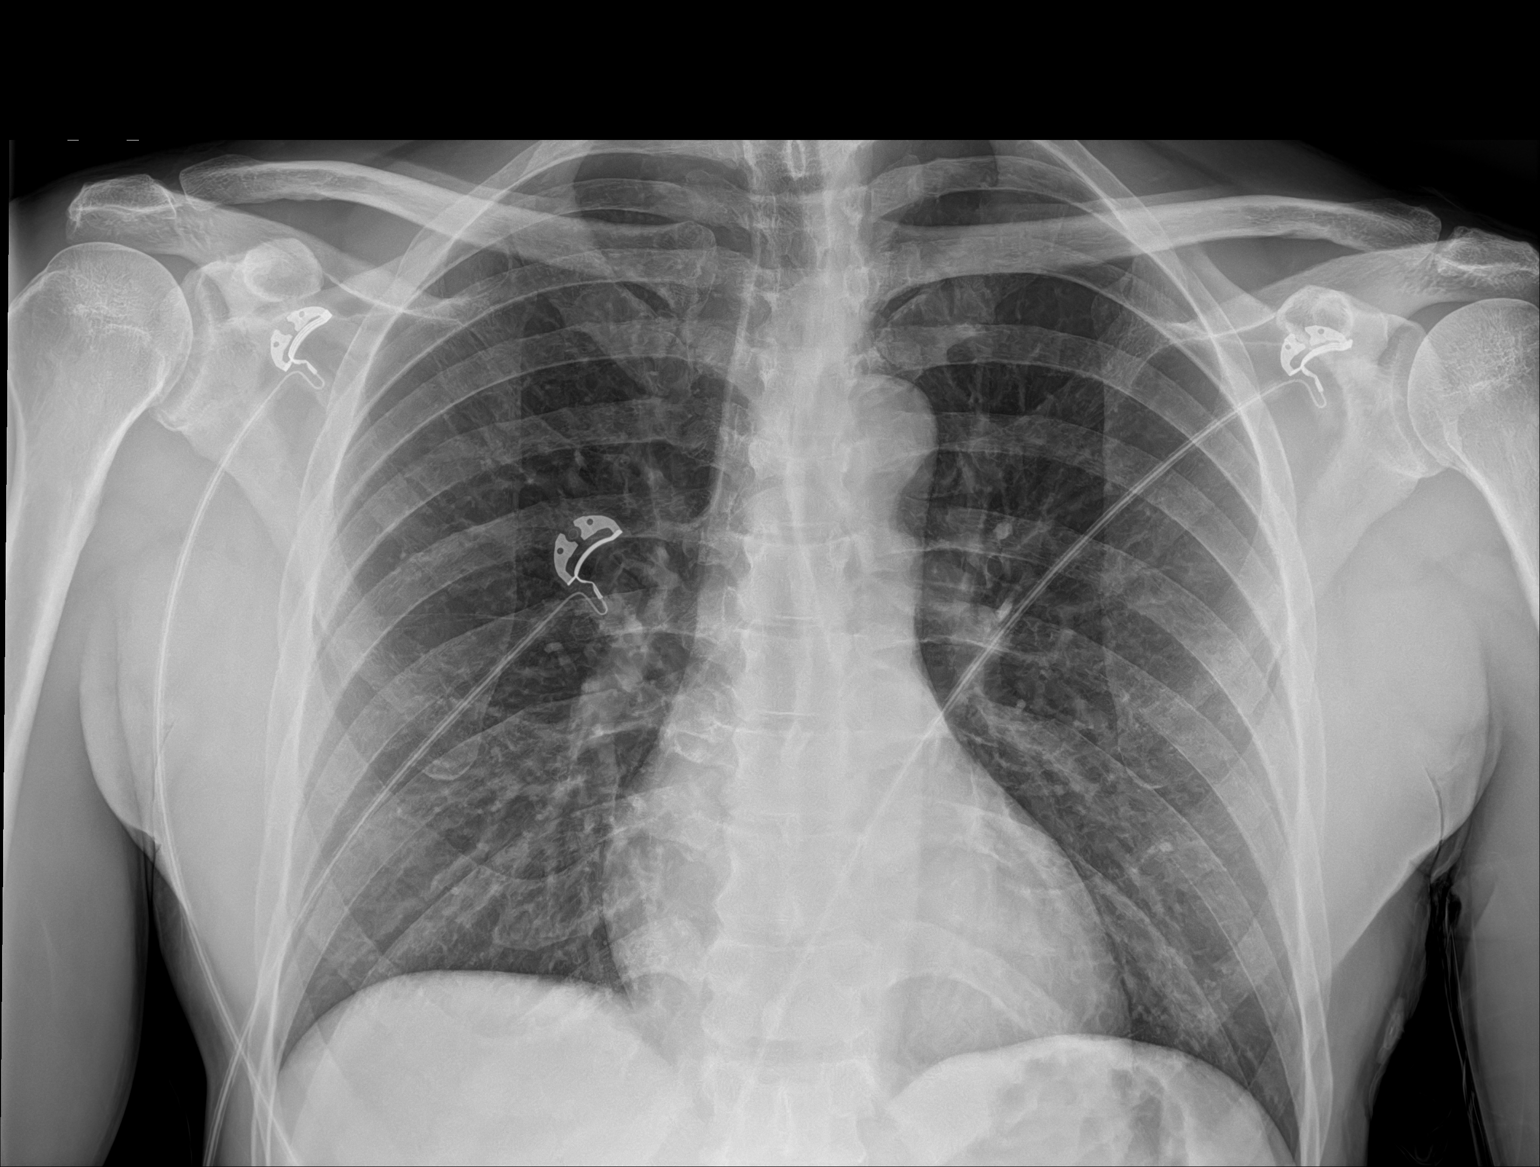
[im 2/2]
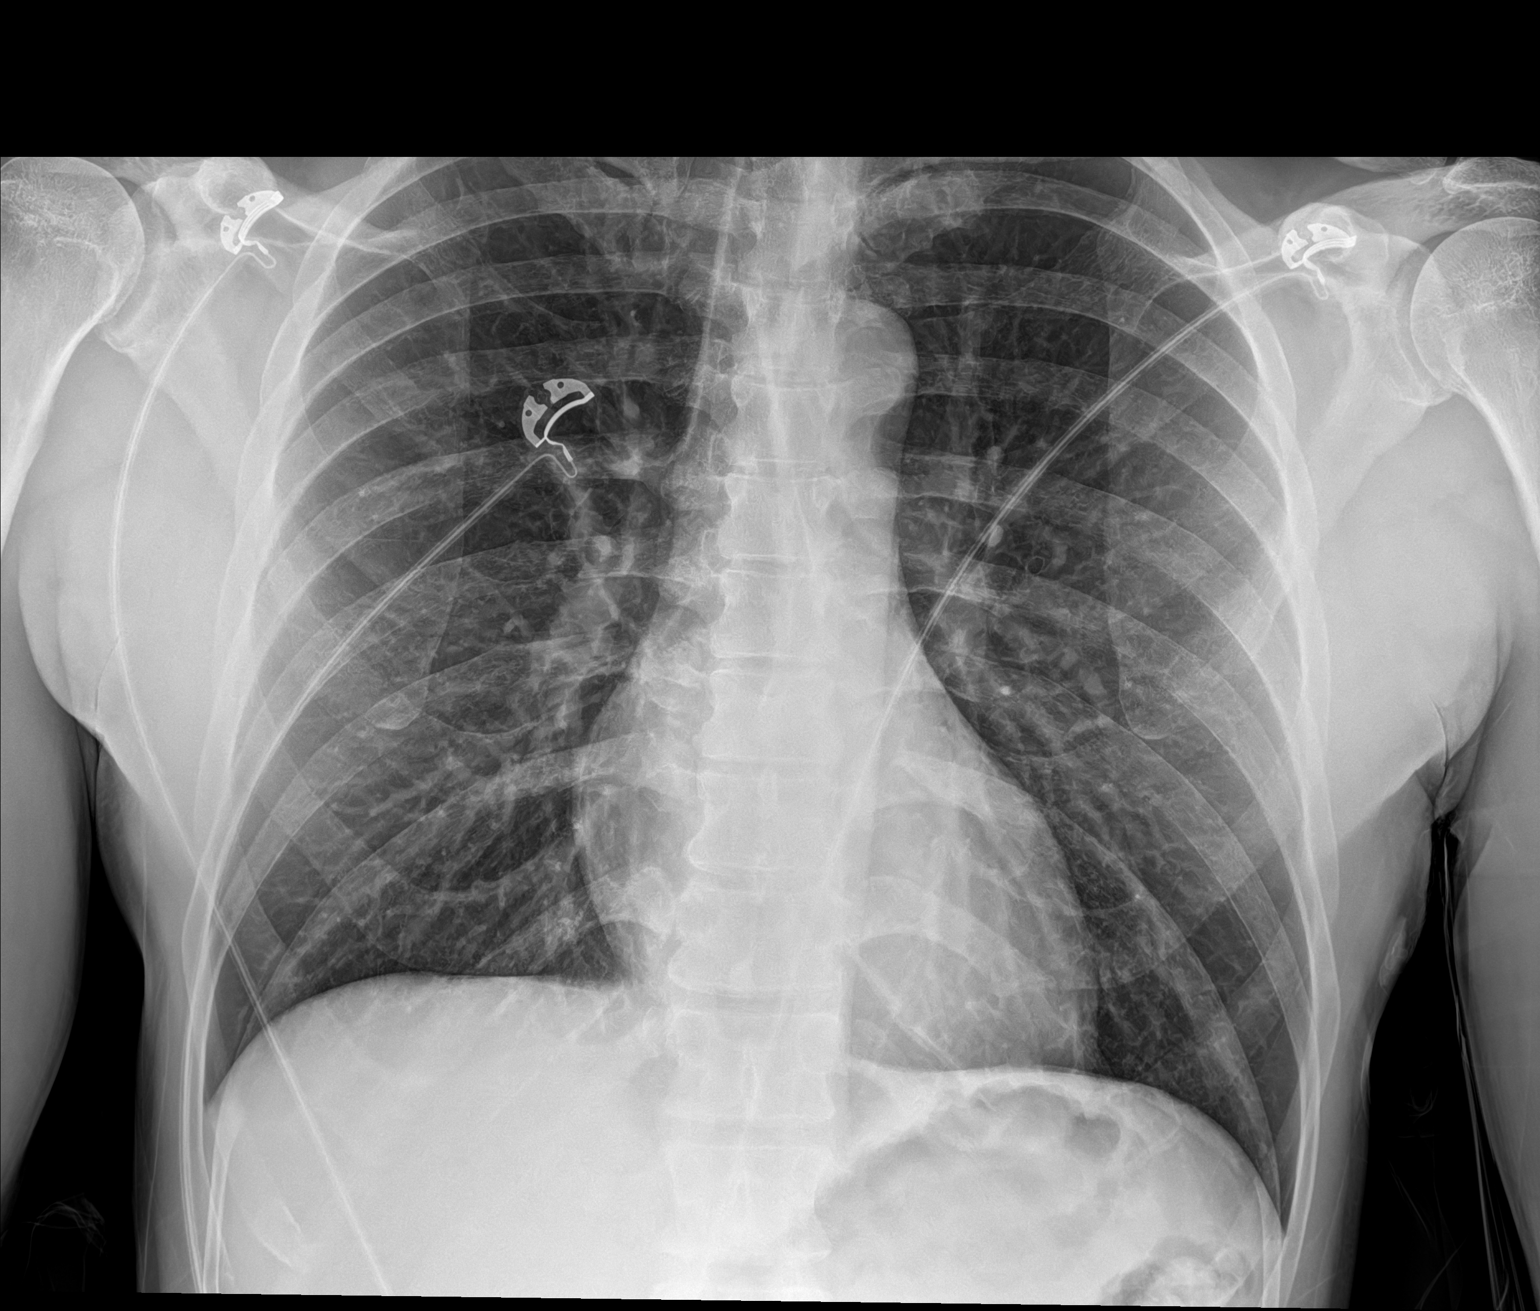

[2 of 2 positions shown; findings below may reference images not displayed]

FINDINGS: The cardiomediastinal silhouette is normal.

There is no focal consolidation or pulmonary edema. There is no
pleural effusion or pneumothorax

There is no acute osseous abnormality.
IMPRESSION: No radiographic evidence of acute cardiopulmonary process.
